# Patient Record
Sex: Female | Born: 1940 | Race: White | Marital: Married | Smoking: Never smoker
Health system: Southern US, Community
[De-identification: ages and names within clinical notes are randomized; demographics above are authoritative.]

## PROBLEM LIST (undated history)

## (undated) DIAGNOSIS — K635 Polyp of colon: Secondary | ICD-10-CM

## (undated) DIAGNOSIS — D689 Coagulation defect, unspecified: Secondary | ICD-10-CM

## (undated) DIAGNOSIS — M810 Age-related osteoporosis without current pathological fracture: Secondary | ICD-10-CM

## (undated) DIAGNOSIS — K649 Unspecified hemorrhoids: Secondary | ICD-10-CM

## (undated) DIAGNOSIS — I10 Essential (primary) hypertension: Secondary | ICD-10-CM

## (undated) DIAGNOSIS — R609 Edema, unspecified: Secondary | ICD-10-CM

## (undated) DIAGNOSIS — C50919 Malignant neoplasm of unspecified site of unspecified female breast: Secondary | ICD-10-CM

## (undated) HISTORY — DX: Malignant neoplasm of unspecified site of unspecified female breast: C50.919

## (undated) HISTORY — DX: Essential (primary) hypertension: I10

## (undated) HISTORY — DX: Coagulation defect, unspecified: D68.9

## (undated) HISTORY — DX: Polyp of colon: K63.5

## (undated) HISTORY — DX: Age-related osteoporosis without current pathological fracture: M81.0

## (undated) HISTORY — DX: Unspecified hemorrhoids: K64.9

## (undated) HISTORY — DX: Edema, unspecified: R60.9

---

## 1973-01-02 HISTORY — PX: DILATATION & CURETTAGE/HYSTEROSCOPY WITH TRUECLEAR: SHX6353

## 1973-01-02 HISTORY — PX: TUBAL LIGATION: SHX77

## 1998-01-02 HISTORY — PX: THYROIDECTOMY, PARTIAL: SHX18

## 2011-02-28 DIAGNOSIS — H6120 Impacted cerumen, unspecified ear: Secondary | ICD-10-CM | POA: Diagnosis not present

## 2011-08-21 DIAGNOSIS — I10 Essential (primary) hypertension: Secondary | ICD-10-CM | POA: Diagnosis not present

## 2011-08-21 DIAGNOSIS — Z1239 Encounter for other screening for malignant neoplasm of breast: Secondary | ICD-10-CM | POA: Diagnosis not present

## 2011-08-21 DIAGNOSIS — E039 Hypothyroidism, unspecified: Secondary | ICD-10-CM | POA: Diagnosis not present

## 2011-08-21 DIAGNOSIS — Z23 Encounter for immunization: Secondary | ICD-10-CM | POA: Diagnosis not present

## 2011-08-21 DIAGNOSIS — Z1211 Encounter for screening for malignant neoplasm of colon: Secondary | ICD-10-CM | POA: Diagnosis not present

## 2012-01-03 DIAGNOSIS — C50919 Malignant neoplasm of unspecified site of unspecified female breast: Secondary | ICD-10-CM

## 2012-01-03 DIAGNOSIS — Z923 Personal history of irradiation: Secondary | ICD-10-CM

## 2012-01-03 HISTORY — DX: Personal history of irradiation: Z92.3

## 2012-01-03 HISTORY — DX: Malignant neoplasm of unspecified site of unspecified female breast: C50.919

## 2012-01-03 HISTORY — PX: BREAST LUMPECTOMY: SHX2

## 2012-04-16 DIAGNOSIS — Z1231 Encounter for screening mammogram for malignant neoplasm of breast: Secondary | ICD-10-CM | POA: Diagnosis not present

## 2012-05-06 DIAGNOSIS — R928 Other abnormal and inconclusive findings on diagnostic imaging of breast: Secondary | ICD-10-CM | POA: Diagnosis not present

## 2012-05-16 DIAGNOSIS — R928 Other abnormal and inconclusive findings on diagnostic imaging of breast: Secondary | ICD-10-CM | POA: Diagnosis not present

## 2012-05-16 DIAGNOSIS — D0590 Unspecified type of carcinoma in situ of unspecified breast: Secondary | ICD-10-CM | POA: Diagnosis not present

## 2012-05-16 DIAGNOSIS — Z1231 Encounter for screening mammogram for malignant neoplasm of breast: Secondary | ICD-10-CM | POA: Diagnosis not present

## 2012-06-04 DIAGNOSIS — C50919 Malignant neoplasm of unspecified site of unspecified female breast: Secondary | ICD-10-CM | POA: Diagnosis not present

## 2012-06-24 DIAGNOSIS — C50419 Malignant neoplasm of upper-outer quadrant of unspecified female breast: Secondary | ICD-10-CM | POA: Diagnosis not present

## 2012-06-24 DIAGNOSIS — M810 Age-related osteoporosis without current pathological fracture: Secondary | ICD-10-CM | POA: Diagnosis not present

## 2012-06-24 DIAGNOSIS — Z9889 Other specified postprocedural states: Secondary | ICD-10-CM | POA: Diagnosis not present

## 2012-06-24 DIAGNOSIS — Z803 Family history of malignant neoplasm of breast: Secondary | ICD-10-CM | POA: Diagnosis not present

## 2012-06-24 DIAGNOSIS — N6019 Diffuse cystic mastopathy of unspecified breast: Secondary | ICD-10-CM | POA: Diagnosis not present

## 2012-06-24 DIAGNOSIS — E785 Hyperlipidemia, unspecified: Secondary | ICD-10-CM | POA: Diagnosis not present

## 2012-06-24 DIAGNOSIS — D0590 Unspecified type of carcinoma in situ of unspecified breast: Secondary | ICD-10-CM | POA: Diagnosis not present

## 2012-06-24 DIAGNOSIS — Z9851 Tubal ligation status: Secondary | ICD-10-CM | POA: Diagnosis not present

## 2012-06-24 DIAGNOSIS — Z86010 Personal history of colonic polyps: Secondary | ICD-10-CM | POA: Diagnosis not present

## 2012-06-24 DIAGNOSIS — N6049 Mammary duct ectasia of unspecified breast: Secondary | ICD-10-CM | POA: Diagnosis not present

## 2012-06-24 DIAGNOSIS — L821 Other seborrheic keratosis: Secondary | ICD-10-CM | POA: Diagnosis not present

## 2012-06-24 DIAGNOSIS — Z79899 Other long term (current) drug therapy: Secondary | ICD-10-CM | POA: Diagnosis not present

## 2012-06-24 DIAGNOSIS — E890 Postprocedural hypothyroidism: Secondary | ICD-10-CM | POA: Diagnosis not present

## 2012-06-24 DIAGNOSIS — C50919 Malignant neoplasm of unspecified site of unspecified female breast: Secondary | ICD-10-CM | POA: Diagnosis not present

## 2012-06-24 DIAGNOSIS — I10 Essential (primary) hypertension: Secondary | ICD-10-CM | POA: Diagnosis not present

## 2012-06-24 DIAGNOSIS — K649 Unspecified hemorrhoids: Secondary | ICD-10-CM | POA: Diagnosis not present

## 2012-06-24 DIAGNOSIS — D485 Neoplasm of uncertain behavior of skin: Secondary | ICD-10-CM | POA: Diagnosis not present

## 2012-06-28 DIAGNOSIS — D0590 Unspecified type of carcinoma in situ of unspecified breast: Secondary | ICD-10-CM | POA: Diagnosis not present

## 2012-06-28 DIAGNOSIS — Z170 Estrogen receptor positive status [ER+]: Secondary | ICD-10-CM | POA: Diagnosis not present

## 2012-07-03 DIAGNOSIS — N841 Polyp of cervix uteri: Secondary | ICD-10-CM | POA: Diagnosis not present

## 2012-07-10 DIAGNOSIS — D0590 Unspecified type of carcinoma in situ of unspecified breast: Secondary | ICD-10-CM | POA: Diagnosis not present

## 2012-07-11 DIAGNOSIS — D0590 Unspecified type of carcinoma in situ of unspecified breast: Secondary | ICD-10-CM | POA: Diagnosis not present

## 2012-07-17 DIAGNOSIS — M899 Disorder of bone, unspecified: Secondary | ICD-10-CM | POA: Diagnosis not present

## 2012-07-17 DIAGNOSIS — E039 Hypothyroidism, unspecified: Secondary | ICD-10-CM | POA: Diagnosis not present

## 2012-07-17 DIAGNOSIS — I10 Essential (primary) hypertension: Secondary | ICD-10-CM | POA: Diagnosis not present

## 2012-07-17 DIAGNOSIS — Z1211 Encounter for screening for malignant neoplasm of colon: Secondary | ICD-10-CM | POA: Diagnosis not present

## 2012-07-30 DIAGNOSIS — D485 Neoplasm of uncertain behavior of skin: Secondary | ICD-10-CM | POA: Diagnosis not present

## 2012-07-31 DIAGNOSIS — N950 Postmenopausal bleeding: Secondary | ICD-10-CM | POA: Diagnosis not present

## 2012-07-31 DIAGNOSIS — N842 Polyp of vagina: Secondary | ICD-10-CM | POA: Diagnosis not present

## 2012-07-31 DIAGNOSIS — I10 Essential (primary) hypertension: Secondary | ICD-10-CM | POA: Diagnosis not present

## 2012-07-31 DIAGNOSIS — E785 Hyperlipidemia, unspecified: Secondary | ICD-10-CM | POA: Diagnosis not present

## 2012-07-31 DIAGNOSIS — Z79899 Other long term (current) drug therapy: Secondary | ICD-10-CM | POA: Diagnosis not present

## 2012-07-31 DIAGNOSIS — C50919 Malignant neoplasm of unspecified site of unspecified female breast: Secondary | ICD-10-CM | POA: Diagnosis not present

## 2012-07-31 DIAGNOSIS — N840 Polyp of corpus uteri: Secondary | ICD-10-CM | POA: Diagnosis not present

## 2012-07-31 DIAGNOSIS — M810 Age-related osteoporosis without current pathological fracture: Secondary | ICD-10-CM | POA: Diagnosis not present

## 2012-07-31 DIAGNOSIS — N841 Polyp of cervix uteri: Secondary | ICD-10-CM | POA: Diagnosis not present

## 2012-07-31 DIAGNOSIS — E890 Postprocedural hypothyroidism: Secondary | ICD-10-CM | POA: Diagnosis not present

## 2012-08-01 DIAGNOSIS — N841 Polyp of cervix uteri: Secondary | ICD-10-CM | POA: Diagnosis not present

## 2012-08-01 DIAGNOSIS — N842 Polyp of vagina: Secondary | ICD-10-CM | POA: Diagnosis not present

## 2012-08-02 DIAGNOSIS — R799 Abnormal finding of blood chemistry, unspecified: Secondary | ICD-10-CM | POA: Diagnosis not present

## 2012-08-02 DIAGNOSIS — R609 Edema, unspecified: Secondary | ICD-10-CM | POA: Diagnosis not present

## 2012-08-02 DIAGNOSIS — D0590 Unspecified type of carcinoma in situ of unspecified breast: Secondary | ICD-10-CM | POA: Diagnosis not present

## 2012-08-02 DIAGNOSIS — I10 Essential (primary) hypertension: Secondary | ICD-10-CM | POA: Diagnosis not present

## 2012-08-07 DIAGNOSIS — M899 Disorder of bone, unspecified: Secondary | ICD-10-CM | POA: Diagnosis not present

## 2012-08-07 DIAGNOSIS — M949 Disorder of cartilage, unspecified: Secondary | ICD-10-CM | POA: Diagnosis not present

## 2012-08-08 DIAGNOSIS — D0590 Unspecified type of carcinoma in situ of unspecified breast: Secondary | ICD-10-CM | POA: Diagnosis not present

## 2012-08-09 DIAGNOSIS — D0590 Unspecified type of carcinoma in situ of unspecified breast: Secondary | ICD-10-CM | POA: Diagnosis not present

## 2012-08-12 DIAGNOSIS — D0590 Unspecified type of carcinoma in situ of unspecified breast: Secondary | ICD-10-CM | POA: Diagnosis not present

## 2012-08-13 DIAGNOSIS — D0590 Unspecified type of carcinoma in situ of unspecified breast: Secondary | ICD-10-CM | POA: Diagnosis not present

## 2012-08-14 DIAGNOSIS — D0590 Unspecified type of carcinoma in situ of unspecified breast: Secondary | ICD-10-CM | POA: Diagnosis not present

## 2012-08-15 DIAGNOSIS — D0590 Unspecified type of carcinoma in situ of unspecified breast: Secondary | ICD-10-CM | POA: Diagnosis not present

## 2012-08-16 DIAGNOSIS — D0590 Unspecified type of carcinoma in situ of unspecified breast: Secondary | ICD-10-CM | POA: Diagnosis not present

## 2012-08-19 DIAGNOSIS — D0590 Unspecified type of carcinoma in situ of unspecified breast: Secondary | ICD-10-CM | POA: Diagnosis not present

## 2012-08-20 DIAGNOSIS — D0590 Unspecified type of carcinoma in situ of unspecified breast: Secondary | ICD-10-CM | POA: Diagnosis not present

## 2012-08-21 DIAGNOSIS — D0590 Unspecified type of carcinoma in situ of unspecified breast: Secondary | ICD-10-CM | POA: Diagnosis not present

## 2012-08-22 DIAGNOSIS — D0590 Unspecified type of carcinoma in situ of unspecified breast: Secondary | ICD-10-CM | POA: Diagnosis not present

## 2012-08-23 DIAGNOSIS — D0590 Unspecified type of carcinoma in situ of unspecified breast: Secondary | ICD-10-CM | POA: Diagnosis not present

## 2012-08-26 DIAGNOSIS — D0590 Unspecified type of carcinoma in situ of unspecified breast: Secondary | ICD-10-CM | POA: Diagnosis not present

## 2012-08-27 DIAGNOSIS — D0590 Unspecified type of carcinoma in situ of unspecified breast: Secondary | ICD-10-CM | POA: Diagnosis not present

## 2012-08-28 DIAGNOSIS — D0590 Unspecified type of carcinoma in situ of unspecified breast: Secondary | ICD-10-CM | POA: Diagnosis not present

## 2012-08-29 DIAGNOSIS — D0590 Unspecified type of carcinoma in situ of unspecified breast: Secondary | ICD-10-CM | POA: Diagnosis not present

## 2012-08-30 DIAGNOSIS — D0590 Unspecified type of carcinoma in situ of unspecified breast: Secondary | ICD-10-CM | POA: Diagnosis not present

## 2012-09-03 DIAGNOSIS — D0590 Unspecified type of carcinoma in situ of unspecified breast: Secondary | ICD-10-CM | POA: Diagnosis not present

## 2012-09-04 DIAGNOSIS — D0590 Unspecified type of carcinoma in situ of unspecified breast: Secondary | ICD-10-CM | POA: Diagnosis not present

## 2012-09-05 DIAGNOSIS — D0590 Unspecified type of carcinoma in situ of unspecified breast: Secondary | ICD-10-CM | POA: Diagnosis not present

## 2012-09-06 DIAGNOSIS — D0590 Unspecified type of carcinoma in situ of unspecified breast: Secondary | ICD-10-CM | POA: Diagnosis not present

## 2012-09-09 DIAGNOSIS — I10 Essential (primary) hypertension: Secondary | ICD-10-CM | POA: Diagnosis not present

## 2012-09-09 DIAGNOSIS — R799 Abnormal finding of blood chemistry, unspecified: Secondary | ICD-10-CM | POA: Diagnosis not present

## 2012-09-09 DIAGNOSIS — D0590 Unspecified type of carcinoma in situ of unspecified breast: Secondary | ICD-10-CM | POA: Diagnosis not present

## 2012-10-02 DIAGNOSIS — D0590 Unspecified type of carcinoma in situ of unspecified breast: Secondary | ICD-10-CM | POA: Diagnosis not present

## 2012-10-10 DIAGNOSIS — I10 Essential (primary) hypertension: Secondary | ICD-10-CM | POA: Diagnosis not present

## 2012-10-10 DIAGNOSIS — K648 Other hemorrhoids: Secondary | ICD-10-CM | POA: Diagnosis not present

## 2012-10-10 DIAGNOSIS — K5730 Diverticulosis of large intestine without perforation or abscess without bleeding: Secondary | ICD-10-CM | POA: Diagnosis not present

## 2012-10-10 DIAGNOSIS — Z860100 Personal history of colon polyps, unspecified: Secondary | ICD-10-CM | POA: Diagnosis not present

## 2012-10-10 DIAGNOSIS — D126 Benign neoplasm of colon, unspecified: Secondary | ICD-10-CM | POA: Diagnosis not present

## 2012-10-10 DIAGNOSIS — Z1211 Encounter for screening for malignant neoplasm of colon: Secondary | ICD-10-CM | POA: Diagnosis not present

## 2012-10-10 DIAGNOSIS — M899 Disorder of bone, unspecified: Secondary | ICD-10-CM | POA: Diagnosis not present

## 2012-10-11 DIAGNOSIS — D126 Benign neoplasm of colon, unspecified: Secondary | ICD-10-CM | POA: Diagnosis not present

## 2012-11-19 DIAGNOSIS — D0590 Unspecified type of carcinoma in situ of unspecified breast: Secondary | ICD-10-CM | POA: Diagnosis not present

## 2012-11-22 DIAGNOSIS — Z23 Encounter for immunization: Secondary | ICD-10-CM | POA: Diagnosis not present

## 2013-03-18 DIAGNOSIS — E039 Hypothyroidism, unspecified: Secondary | ICD-10-CM | POA: Diagnosis not present

## 2013-03-18 DIAGNOSIS — I10 Essential (primary) hypertension: Secondary | ICD-10-CM | POA: Diagnosis not present

## 2013-03-18 DIAGNOSIS — E7800 Pure hypercholesterolemia, unspecified: Secondary | ICD-10-CM | POA: Diagnosis not present

## 2013-03-18 DIAGNOSIS — Z853 Personal history of malignant neoplasm of breast: Secondary | ICD-10-CM | POA: Diagnosis not present

## 2013-03-26 DIAGNOSIS — D0590 Unspecified type of carcinoma in situ of unspecified breast: Secondary | ICD-10-CM | POA: Diagnosis not present

## 2013-03-27 DIAGNOSIS — D0590 Unspecified type of carcinoma in situ of unspecified breast: Secondary | ICD-10-CM | POA: Diagnosis not present

## 2013-03-27 DIAGNOSIS — Z853 Personal history of malignant neoplasm of breast: Secondary | ICD-10-CM | POA: Diagnosis not present

## 2013-03-28 DIAGNOSIS — M79609 Pain in unspecified limb: Secondary | ICD-10-CM | POA: Diagnosis not present

## 2013-03-28 DIAGNOSIS — I82409 Acute embolism and thrombosis of unspecified deep veins of unspecified lower extremity: Secondary | ICD-10-CM | POA: Diagnosis not present

## 2013-04-07 DIAGNOSIS — I82890 Acute embolism and thrombosis of other specified veins: Secondary | ICD-10-CM | POA: Diagnosis not present

## 2013-04-07 DIAGNOSIS — I809 Phlebitis and thrombophlebitis of unspecified site: Secondary | ICD-10-CM | POA: Diagnosis not present

## 2013-04-08 DIAGNOSIS — I749 Embolism and thrombosis of unspecified artery: Secondary | ICD-10-CM | POA: Diagnosis not present

## 2013-04-14 DIAGNOSIS — I809 Phlebitis and thrombophlebitis of unspecified site: Secondary | ICD-10-CM | POA: Diagnosis not present

## 2013-04-14 DIAGNOSIS — D0590 Unspecified type of carcinoma in situ of unspecified breast: Secondary | ICD-10-CM | POA: Diagnosis not present

## 2013-05-06 DIAGNOSIS — I749 Embolism and thrombosis of unspecified artery: Secondary | ICD-10-CM | POA: Diagnosis not present

## 2013-06-16 DIAGNOSIS — D0590 Unspecified type of carcinoma in situ of unspecified breast: Secondary | ICD-10-CM | POA: Diagnosis not present

## 2013-06-16 DIAGNOSIS — I809 Phlebitis and thrombophlebitis of unspecified site: Secondary | ICD-10-CM | POA: Diagnosis not present

## 2013-08-15 ENCOUNTER — Telehealth: Payer: Self-pay | Admitting: *Deleted

## 2013-08-15 NOTE — Telephone Encounter (Signed)
Received records from outside facility and I called pt and confirmed 08/28/13 appt w/ her.  Placed note for pt to be given intake form at time of check in.  Took paperwork to HIM to create chart.

## 2013-08-28 ENCOUNTER — Encounter: Payer: Self-pay | Admitting: Hematology and Oncology

## 2013-08-28 ENCOUNTER — Ambulatory Visit: Payer: Medicare Other

## 2013-08-28 ENCOUNTER — Encounter (INDEPENDENT_AMBULATORY_CARE_PROVIDER_SITE_OTHER): Payer: Self-pay

## 2013-08-28 ENCOUNTER — Ambulatory Visit (HOSPITAL_BASED_OUTPATIENT_CLINIC_OR_DEPARTMENT_OTHER): Payer: Medicare Other | Admitting: Hematology and Oncology

## 2013-08-28 VITALS — BP 118/74 | HR 82 | Temp 97.8°F | Resp 18 | Ht 64.0 in | Wt 151.4 lb

## 2013-08-28 DIAGNOSIS — M810 Age-related osteoporosis without current pathological fracture: Secondary | ICD-10-CM

## 2013-08-28 DIAGNOSIS — D0590 Unspecified type of carcinoma in situ of unspecified breast: Secondary | ICD-10-CM | POA: Diagnosis not present

## 2013-08-28 DIAGNOSIS — Z923 Personal history of irradiation: Secondary | ICD-10-CM | POA: Diagnosis not present

## 2013-08-28 DIAGNOSIS — Z86718 Personal history of other venous thrombosis and embolism: Secondary | ICD-10-CM

## 2013-08-28 DIAGNOSIS — D0512 Intraductal carcinoma in situ of left breast: Secondary | ICD-10-CM | POA: Insufficient documentation

## 2013-08-28 DIAGNOSIS — C50919 Malignant neoplasm of unspecified site of unspecified female breast: Secondary | ICD-10-CM

## 2013-08-28 DIAGNOSIS — C50912 Malignant neoplasm of unspecified site of left female breast: Secondary | ICD-10-CM

## 2013-08-28 MED ORDER — EXEMESTANE 25 MG PO TABS: 25.0000 mg | ORAL_TABLET | Freq: Every day | ORAL | Status: DC

## 2013-08-28 NOTE — Progress Notes (Signed)
[facility] CONSULT NOTE  Patient Care Team: Lajean Manes, MD as PCP - General (Internal Medicine)  CHIEF COMPLAINTS/PURPOSE OF CONSULTATION:  To establish oncology care with a history of left breast DCIS  HISTORY OF PRESENTING ILLNESS:  Laurie Gomez 73 y.o. Caucasian female is here because of prior history of left breast DCIS. She presented with a routine screening mammogram that revealed an abnormality in the left breast that led to a biopsy that revealed DCIS Nuclear grade 3 solid and comedo types associated with dystrophic calcifications and probable early invasive carcinoma.. She then went on to undergo a left breast lumpectomy on 06/24/2012 that revealed DCIS high grade with central necrosis comedo carcinoma occasional focus suspicious but not diagnostic for microinvasion node-negative margins -2 sentinel lymph nodes negative. Patient was staged as Tis N0 MX high-grade DCIS.  She could not tolerate adjuvant hormonal therapy. Initially she was given tamoxifen but developed a superficial vein thrombosis and had to stop tamoxifen. She feels are out of for 3 months that resolve the thrombus. She was put on Aromasin which caused her intense body aches and pains and she herself stopped the medication beginning of August 2015. She decided to move to [facility] to be closer to her family and is here today to establish care with [place]. She reports that she and her husband are very active day 1 to help take care of the grandchildren and helping her daughter and her family who've on business in this area.  I reviewed her records extensively and collaborated the history with the patient.  SUMMARY OF ONCOLOGIC HISTORY:   Breast cancer   06/24/2012 Surgery Left breast lumpectomy and sentinel lymph node biopsy DCIS   08/30/2012 - 09/28/2012 Radiation Therapy Radiation to lumpectomy site   10/02/2012 -  Anti-estrogen oral therapy Tamoxifen 20 mg daily started 10/02/2012 discontinued due to  superficial leg vein thrombosis that was treated with 3 months of Xarelto. Aromasin 25 mg started July 2015 stopped for diffuse muscle aches, restarted 08/28/2013 to take with Claritin-D    In terms of breast cancer risk profile:  She menarched at early age of 44 and went to menopause at age 46  She had 2 pregnancy, her first child was born at age 4  She did not received birth control pills .  She was never exposed to fertility medications or hormone replacement therapy.  She has family history of Breast cancer  MEDICAL HISTORY:  Past Medical History  Diagnosis Date  . Breast cancer   . Osteoporosis   . superifical thrombosis   . Colon polyps   . Hemorrhoids   . Edema   . Hypertension     SURGICAL HISTORY: Past Surgical History  Procedure Laterality Date  . Thyroidectomy, partial  2000  . Breast lumpectomy  2014  . Tubal ligation  1975  . Dilatation & curettage/hysteroscopy with trueclear  1975    SOCIAL HISTORY: History   Social History  . Marital Status: Married    Spouse Name: N/A    Number of Children: N/A  . Years of Education: N/A   Occupational History  . Not on file.   Social History Main Topics  . Smoking status: Never Smoker   . Smokeless tobacco: Never Used  . Alcohol Use: Yes  . Drug Use: No  . Sexual Activity: Yes   Other Topics Concern  . Not on file   Social History Narrative  . No narrative on file    FAMILY HISTORY: Family History  Problem Relation Age of Onset  . Cancer Sister 43    Breast cancer  . Cancer Cousin 64    Breast cancer    ALLERGIES:  has no allergies on file.  MEDICATIONS:  Current Outpatient Prescriptions  Medication Sig Dispense Refill  . amlodipine-benazepril (LOTREL) 2.5-10 MG per capsule Take 1 capsule by mouth.      . levothyroxine (SYNTHROID) 75 MCG tablet TAKE ONE TABLET BY MOUTH ONE TIME DAILY      . triamterene-hydrochlorothiazide (MAXZIDE) 75-50 MG per tablet TAKE 1/2 tablet BY MOUTH ONE TIME DAILY       . Calcium Carbonate-Vitamin D 600-125 MG-UNIT TABS Take 1 tablet by mouth.      Marland Kitchen exemestane (AROMASIN) 25 MG tablet Take 1 tablet (25 mg total) by mouth daily after breakfast.  90 tablet  3  . Multiple Vitamin (MULTIVITAMIN) tablet Take 1 tablet by mouth.      . vitamin B-12 (CYANOCOBALAMIN) 50 MCG tablet Take 2 tablets by mouth.       No current facility-administered medications for this visit.    REVIEW OF SYSTEMS:   Constitutional: Denies fevers, chills or abnormal night sweats Eyes: Denies blurriness of vision, double vision or watery eyes Ears, nose, mouth, throat, and face: Denies mucositis or sore throat Respiratory: Denies cough, dyspnea or wheezes Cardiovascular: Denies palpitation, chest discomfort or lower extremity swelling Gastrointestinal:  Denies nausea, heartburn or change in bowel habits Skin: Denies abnormal skin rashes Lymphatics: Denies new lymphadenopathy or easy bruising Neurological:Denies numbness, tingling or new weaknesses Behavioral/Psych: Mood is stable, no new changes  Breast: Left breast nodularity ever since radiation was done All other systems were reviewed with the patient and are negative.  PHYSICAL EXAMINATION: ECOG PERFORMANCE STATUS: 0 - Asymptomatic  Filed Vitals:   08/28/13 1420  BP: 118/74  Pulse: 82  Temp: 97.8 F (36.6 C)  Resp: 18   Filed Weights   08/28/13 1420  Weight: 151 lb 6.4 oz (68.675 kg)    GENERAL:alert, no distress and comfortable SKIN: skin color, texture, turgor are normal, no rashes or significant lesions EYES: normal, conjunctiva are pink and non-injected, sclera clear OROPHARYNX:no exudate, no erythema and lips, buccal mucosa, and tongue normal  NECK: supple, thyroid normal size, non-tender, without nodularity LYMPH:  no palpable lymphadenopathy in the cervical, axillary or inguinal LUNGS: clear to auscultation and percussion with normal breathing effort HEART: regular rate & rhythm and no murmurs and no lower  extremity edema ABDOMEN:abdomen soft, non-tender and normal bowel sounds Musculoskeletal:no cyanosis of digits and no clubbing  PSYCH: alert & oriented x 3 with fluent speech NEURO: no focal motor/sensory deficits BREAST: No palpable nodules in breast. No palpable axillary or supraclavicular lymphadenopathy. Left breast skin and subcutis tissues feel thickened and nodular but no actual lumps are palpable  LABORATORY DATA:   RADIOGRAPHIC STUDIES: March 2015 mammograms no abnormalities noted  ASSESSMENT AND PLAN:  Breast cancer DCIS left breast status post lumpectomy and radiation therapy: Patient developed SVT on tamoxifen and had to be stopped. She attempted Aromasin briefly but a month and had to stop it because of diffuse muscle aches and pains. She will in [place] to be closer to her family and is here today to establish oncology care with [place]. She gets annual mammograms in March of every year. March 2014 was her last mammogram. She reports increased density in the left breast ever since he got radiation therapy.  Breast exam did not reveal any abnormalities of significance. The left breast  felt thickened in the skin and subcutaneous tissues to the radiation therapy. I recommended a followup in March after undergoing a mammogram.  Survivorship: Encouraged her to physical activity and exercise daily along with increasing fruits and vegetable intake and decreasing red meat.   All questions were answered. The patient knows to call the clinic with any problems, questions or concerns. I spent 40 minutes counseling the patient face to face. The total time spent in the appointment was 60 minutes and more than 50% was on counseling.     Rulon Eisenmenger, MD 08/28/2013 3:40 PM

## 2013-08-28 NOTE — Progress Notes (Signed)
Checked in new pt with no financial concerns. °

## 2013-08-28 NOTE — Assessment & Plan Note (Signed)
DCIS left breast status post lumpectomy and radiation therapy: Patient developed SVT on tamoxifen and had to be stopped. She attempted Aromasin briefly but a month and had to stop it because of diffuse muscle aches and pains. She will in [place] to be closer to her family and is here today to establish oncology care with [place]. She gets annual mammograms in March of every year. March 2014 was her last mammogram. She reports increased density in the left breast ever since he got radiation therapy.  Breast exam did not reveal any abnormalities of significance. The left breast felt thickened in the skin and subcutaneous tissues to the radiation therapy. I recommended a followup in March after undergoing a mammogram.  Survivorship: Encouraged her to physical activity and exercise daily along with increasing fruits and vegetable intake and decreasing red meat.

## 2013-08-29 ENCOUNTER — Telehealth: Payer: Self-pay | Admitting: Hematology and Oncology

## 2013-08-29 DIAGNOSIS — Z79899 Other long term (current) drug therapy: Secondary | ICD-10-CM | POA: Diagnosis not present

## 2013-08-29 DIAGNOSIS — R609 Edema, unspecified: Secondary | ICD-10-CM | POA: Diagnosis not present

## 2013-08-29 DIAGNOSIS — C50919 Malignant neoplasm of unspecified site of unspecified female breast: Secondary | ICD-10-CM | POA: Diagnosis not present

## 2013-08-29 DIAGNOSIS — I10 Essential (primary) hypertension: Secondary | ICD-10-CM | POA: Diagnosis not present

## 2013-08-29 DIAGNOSIS — E039 Hypothyroidism, unspecified: Secondary | ICD-10-CM | POA: Diagnosis not present

## 2013-08-29 DIAGNOSIS — Z23 Encounter for immunization: Secondary | ICD-10-CM | POA: Diagnosis not present

## 2013-08-29 NOTE — Telephone Encounter (Signed)
, °

## 2013-09-02 ENCOUNTER — Encounter: Payer: Self-pay | Admitting: Hematology and Oncology

## 2013-09-02 DIAGNOSIS — E039 Hypothyroidism, unspecified: Secondary | ICD-10-CM | POA: Diagnosis not present

## 2013-09-02 DIAGNOSIS — Z79899 Other long term (current) drug therapy: Secondary | ICD-10-CM | POA: Diagnosis not present

## 2013-09-02 DIAGNOSIS — I10 Essential (primary) hypertension: Secondary | ICD-10-CM | POA: Diagnosis not present

## 2013-09-03 ENCOUNTER — Telehealth: Payer: Self-pay

## 2013-09-03 DIAGNOSIS — C50919 Malignant neoplasm of unspecified site of unspecified female breast: Secondary | ICD-10-CM

## 2013-09-03 NOTE — Telephone Encounter (Signed)
Message printed and to provider for review.  Established patient work in appointment request for today by collaborative nurse.

## 2013-09-03 NOTE — Telephone Encounter (Signed)
Call rcvd from Dr. Felipa Eth 9/1 re: LFT he had done.  Alk Phos 152, AST 259 ALT 381.  Pt sent MyChart msg 9/2 to Dr. Lindi Adie.  Per Dr Lindi Adie, bring pt in for labs and ofc visit.  Ofc visit avail at 77.  Let pt know labs at 1030, office visit 11.  Pt voiced understanding.  Appts made.  Labs entered

## 2013-09-04 ENCOUNTER — Ambulatory Visit (HOSPITAL_BASED_OUTPATIENT_CLINIC_OR_DEPARTMENT_OTHER): Payer: Medicare Other | Admitting: Hematology and Oncology

## 2013-09-04 ENCOUNTER — Other Ambulatory Visit (HOSPITAL_BASED_OUTPATIENT_CLINIC_OR_DEPARTMENT_OTHER): Payer: Medicare Other

## 2013-09-04 ENCOUNTER — Telehealth: Payer: Self-pay | Admitting: Hematology and Oncology

## 2013-09-04 ENCOUNTER — Other Ambulatory Visit: Payer: Self-pay | Admitting: Hematology and Oncology

## 2013-09-04 VITALS — BP 132/76 | HR 77 | Temp 98.3°F | Resp 18 | Ht <= 58 in | Wt 178.1 lb

## 2013-09-04 DIAGNOSIS — D0590 Unspecified type of carcinoma in situ of unspecified breast: Secondary | ICD-10-CM

## 2013-09-04 DIAGNOSIS — K759 Inflammatory liver disease, unspecified: Secondary | ICD-10-CM

## 2013-09-04 DIAGNOSIS — C50919 Malignant neoplasm of unspecified site of unspecified female breast: Secondary | ICD-10-CM

## 2013-09-04 LAB — COMPREHENSIVE METABOLIC PANEL (CC13)
ALK PHOS: 198 U/L — AB (ref 40–150)
ANION GAP: 9 meq/L (ref 3–11)
AST: 100 U/L — ABNORMAL HIGH (ref 5–34)
Albumin: 3.7 g/dL (ref 3.5–5.0)
BILIRUBIN TOTAL: 0.38 mg/dL (ref 0.20–1.20)
BUN: 19.4 mg/dL (ref 7.0–26.0)
CO2: 26 mEq/L (ref 22–29)
Calcium: 9.4 mg/dL (ref 8.4–10.4)
Chloride: 103 mEq/L (ref 98–109)
Creatinine: 1.3 mg/dL — ABNORMAL HIGH (ref 0.6–1.1)
GLUCOSE: 89 mg/dL (ref 70–140)
Potassium: 4 mEq/L (ref 3.5–5.1)
Sodium: 138 mEq/L (ref 136–145)
TOTAL PROTEIN: 7.3 g/dL (ref 6.4–8.3)

## 2013-09-04 LAB — CBC WITH DIFFERENTIAL/PLATELET
BASO%: 0.7 % (ref 0.0–2.0)
Basophils Absolute: 0.1 10*3/uL (ref 0.0–0.1)
EOS%: 4 % (ref 0.0–7.0)
Eosinophils Absolute: 0.3 10*3/uL (ref 0.0–0.5)
HEMATOCRIT: 40.3 % (ref 34.8–46.6)
HGB: 13.3 g/dL (ref 11.6–15.9)
LYMPH%: 31.4 % (ref 14.0–49.7)
MCH: 29.3 pg (ref 25.1–34.0)
MCHC: 33 g/dL (ref 31.5–36.0)
MCV: 88.8 fL (ref 79.5–101.0)
MONO#: 0.8 10*3/uL (ref 0.1–0.9)
MONO%: 11.9 % (ref 0.0–14.0)
NEUT#: 3.7 10*3/uL (ref 1.5–6.5)
NEUT%: 52 % (ref 38.4–76.8)
Platelets: 229 10*3/uL (ref 145–400)
RBC: 4.54 10*6/uL (ref 3.70–5.45)
RDW: 13.8 % (ref 11.2–14.5)
WBC: 7.1 10*3/uL (ref 3.9–10.3)
lymph#: 2.2 10*3/uL (ref 0.9–3.3)

## 2013-09-04 NOTE — Assessment & Plan Note (Signed)
Elevated ALT AST and alkaline phosphatase. We do not have a baseline liver function tests prior to starting Aromasin. I reviewed her liver function tests from her primary care physician office and cons. It appears that the AST has come down but the ALT is continuing to remain high at 375 today. As mentioned above he would like to afford Aromasin and recheck labs in a month. Also get an ultrasound of the abdomen and hepatitis viral titers.

## 2013-09-04 NOTE — Telephone Encounter (Signed)
per pof to sch pt appt-sch appt-sent VG an email to adv to put order in -adv pt someone will call her to sch once appt put in-pt understood

## 2013-09-04 NOTE — Assessment & Plan Note (Signed)
DCIS left breast teslas lumpectomy and radiation: Patient could not tolerate tamoxifen because of SVT. I initiated Aromasin on August 27 and she was noted to have elevated AST ALT and alkaline phosphatase by Dr. Felipa Eth her primary care physician. She was complaining of epigastric distress and discomfort and because of both of these issues she is here today in our office. Her exam does not reveal any tenderness in the epigastrium or in the gallbladder area. Blood work done today reveals that she continues to have elevated ALT 375, alkaline phosphatase is gone up 198 and AST has come down. It is unclear the cause of elevated liver function tests it could be related to Aromasin although it is very rare cholestatic jaundice has been reported. Her bilirubin is normal hands there is no evidence of jaundice. She also takes lots of supplements I am not sure which may have interacted with other medications can cause elevation of AST and ALT.  I would like to hold Aromasin for a month and recheck her labs a month later. We will get an ultrasound of the abdomen and when she comes back we will send for hepatitis A, B, and C.

## 2013-09-04 NOTE — Progress Notes (Signed)
Patient Care Team: Lajean Manes, MD as PCP - General (Internal Medicine)  DIAGNOSIS: Breast cancer   Primary site: Breast (Left)   Staging method: AJCC 7th Edition   Clinical: (Tis (DCIS))   Pathologic free text: [id number]   Pathologic: (Tis (DCIS)) signed by Rulon Eisenmenger, MD on 08/28/2013  3:35 PM   Summary: (Tis (DCIS))   SUMMARY OF ONCOLOGIC HISTORY:   Breast cancer   06/24/2012 Surgery Left breast lumpectomy and sentinel lymph node biopsy DCIS   08/30/2012 - 09/28/2012 Radiation Therapy Radiation to lumpectomy site   10/02/2012 -  Anti-estrogen oral therapy Tamoxifen 20 mg daily started 10/02/2012 discontinued due to superficial leg vein thrombosis that was treated with 3 months of Xarelto. Aromasin 25 mg started July 2015 stopped for diffuse muscle aches, restarted 08/28/2013 to take with Claritin-D    CHIEF COMPLIANT: Epigastric abdominal discomfort  INTERVAL HISTORY: Ms. Laurie Gomez is a 73 year old Caucasian lady with above-mentioned history of DCIS could not tolerate tamoxifen because of the facial vein thrombosis. I started her on antiestrogen therapy with Aromasin in August 2015 to take with Claritin-D. After taking this he started noticing abdominal epigastric discomfort. She saw her primary care physician who ordered blood tests that showed elevation of ALT AST and alkaline phosphatase. Because of these issues we are seeing her today. It appears that the muscle cramps that she had previously with Aromasin are no longer there because of Claritin-D. She tried to take the Aromasin at different times of the day but she seems to get abdominal discomfort at the end of the day no matter what.   REVIEW OF SYSTEMS:   Constitutional: Denies fevers, chills or abnormal weight loss Eyes: Denies blurriness of vision Ears, nose, mouth, throat, and face: Denies mucositis or sore throat Respiratory: Denies cough, dyspnea or wheezes Cardiovascular: Denies palpitation, chest discomfort or lower extremity  swelling Gastrointestinal:  Denies nausea, heartburn or change in bowel habits Skin: Denies abnormal skin rashes Lymphatics: Denies new lymphadenopathy or easy bruising Neurological:Denies numbness, tingling or new weaknesses Behavioral/Psych: Mood is stable, no new changes  Breast:  denies any pain or lumps or nodules in either breasts All other systems were reviewed with the patient and are negative.  I have reviewed the past medical history, past surgical history, social history and family history with the patient and they are unchanged from previous note.  ALLERGIES:  has no allergies on file.  MEDICATIONS:  Current Outpatient Prescriptions  Medication Sig Dispense Refill  . amlodipine-benazepril (LOTREL) 2.5-10 MG per capsule Take 1 capsule by mouth.      . Calcium Carbonate-Vitamin D 600-125 MG-UNIT TABS Take 1 tablet by mouth.      Marland Kitchen exemestane (AROMASIN) 25 MG tablet Take 1 tablet (25 mg total) by mouth daily after breakfast.  90 tablet  3  . levothyroxine (SYNTHROID) 75 MCG tablet TAKE ONE TABLET BY MOUTH ONE TIME DAILY      . Multiple Vitamin (MULTIVITAMIN) tablet Take 1 tablet by mouth.      . triamterene-hydrochlorothiazide (MAXZIDE) 75-50 MG per tablet TAKE 1/2 tablet BY MOUTH ONE TIME DAILY      . vitamin B-12 (CYANOCOBALAMIN) 50 MCG tablet Take 2 tablets by mouth.       No current facility-administered medications for this visit.    PHYSICAL EXAMINATION: ECOG PERFORMANCE STATUS: 1 - Symptomatic but completely ambulatory  Filed Vitals:   09/04/13 1048  BP: 132/76  Pulse: 77  Temp: 98.3 F (36.8 C)  Resp: 18   Filed  Weights   09/04/13 1048  Weight: 178 lb 2 oz (80.797 kg)    GENERAL:alert, no distress and comfortable SKIN: skin color, texture, turgor are normal, no rashes or significant lesions EYES: normal, Conjunctiva are pink and non-injected, sclera clear OROPHARYNX:no exudate, no erythema and lips, buccal mucosa, and tongue normal  NECK: supple,  thyroid normal size, non-tender, without nodularity LYMPH:  no palpable lymphadenopathy in the cervical, axillary or inguinal LUNGS: clear to auscultation and percussion with normal breathing effort HEART: regular rate & rhythm and no murmurs and no lower extremity edema ABDOMEN:abdomen soft, non-tender and normal bowel sounds Musculoskeletal:no cyanosis of digits and no clubbing  NEURO: alert & oriented x 3 with fluent speech, no focal motor/sensory deficits  LABORATORY DATA:  I have reviewed the data as listed   Chemistry      Component Value Date/Time   NA 138 09/04/2013 1026   K 4.0 09/04/2013 1026   CO2 26 09/04/2013 1026   BUN 19.4 09/04/2013 1026   CREATININE 1.3* 09/04/2013 1026      Component Value Date/Time   CALCIUM 9.4 09/04/2013 1026   ALKPHOS 198* 09/04/2013 1026   AST 100* 09/04/2013 1026   ALT 375 Repeated and Verified* 09/04/2013 1026   BILITOT 0.38 09/04/2013 1026       Lab Results  Component Value Date   WBC 7.1 09/04/2013   HGB 13.3 09/04/2013   HCT 40.3 09/04/2013   MCV 88.8 09/04/2013   PLT 229 09/04/2013   NEUTROABS 3.7 09/04/2013     RADIOGRAPHIC STUDIES: I have personally reviewed the radiology reports and agreed with their findings. No results found.   ASSESSMENT & PLAN:  Breast cancer DCIS left breast teslas lumpectomy and radiation: Patient could not tolerate tamoxifen because of SVT. I initiated Aromasin on August 27 and she was noted to have elevated AST ALT and alkaline phosphatase by Dr. Felipa Eth her primary care physician. She was complaining of epigastric distress and discomfort and because of both of these issues she is here today in our office. Her exam does not reveal any tenderness in the epigastrium or in the gallbladder area. Blood work done today reveals that she continues to have elevated ALT 375, alkaline phosphatase is gone up 198 and AST has come down. It is unclear the cause of elevated liver function tests it could be related to Aromasin although it is  very rare cholestatic jaundice has been reported. Her bilirubin is normal hands there is no evidence of jaundice. She also takes lots of supplements I am not sure which may have interacted with other medications can cause elevation of AST and ALT.  I would like to hold Aromasin for a month and recheck her labs a month later. We will get an ultrasound of the abdomen and when she comes back we will send for hepatitis A, B, and C.  Hepatitis Elevated ALT AST and alkaline phosphatase. We do not have a baseline liver function tests prior to starting Aromasin. I reviewed her liver function tests from her primary care physician office and cons. It appears that the AST has come down but the ALT is continuing to remain high at 375 today. As mentioned above he would like to afford Aromasin and recheck labs in a month. Also get an ultrasound of the abdomen and hepatitis viral titers.   Orders Placed This Encounter  Procedures  . Comprehensive metabolic panel (Cmet) - [facility]    Standing Status: Future     Number of Occurrences:  Standing Expiration Date: 09/04/2014  . Lactate dehydrogenase (LDH) - [facility]    Standing Status: Future     Number of Occurrences:      Standing Expiration Date: 09/04/2014  . Hepatitis B surface antigen    Standing Status: Future     Number of Occurrences:      Standing Expiration Date: 09/04/2014  . Hepatitis C antibody    Standing Status: Future     Number of Occurrences:      Standing Expiration Date: 09/04/2014  . Hepatitis B core antibody, total    Standing Status: Future     Number of Occurrences:      Standing Expiration Date: 09/04/2014  . Hepatitis A antibody, IgM    Standing Status: Future     Number of Occurrences:      Standing Expiration Date: 09/04/2014  . Hepatitis B surface antibody    Standing Status: Future     Number of Occurrences:      Standing Expiration Date: 09/04/2014   The patient has a good understanding of the overall plan. she agrees with it. She  will call with any problems that may develop before her next visit here.  I spent 25 minutes counseling the patient face to face. The total time spent in the appointment was 30 minutes and more than 50% was on counseling and review of test results    Rulon Eisenmenger, MD 09/04/2013 12:01 PM

## 2013-09-09 ENCOUNTER — Ambulatory Visit (HOSPITAL_COMMUNITY)
Admission: RE | Admit: 2013-09-09 | Discharge: 2013-09-09 | Disposition: A | Discharge status: Home / Self Care | Payer: Medicare Other | Source: Ambulatory Visit | Attending: Hematology and Oncology | Admitting: Hematology and Oncology

## 2013-09-09 DIAGNOSIS — K759 Inflammatory liver disease, unspecified: Secondary | ICD-10-CM | POA: Diagnosis not present

## 2013-09-09 DIAGNOSIS — R748 Abnormal levels of other serum enzymes: Secondary | ICD-10-CM | POA: Diagnosis not present

## 2013-09-09 DIAGNOSIS — R945 Abnormal results of liver function studies: Secondary | ICD-10-CM | POA: Insufficient documentation

## 2013-09-09 DIAGNOSIS — R932 Abnormal findings on diagnostic imaging of liver and biliary tract: Secondary | ICD-10-CM | POA: Insufficient documentation

## 2013-09-16 ENCOUNTER — Encounter: Payer: Self-pay | Admitting: Hematology and Oncology

## 2013-09-17 ENCOUNTER — Encounter: Payer: Self-pay | Admitting: *Deleted

## 2013-09-19 DIAGNOSIS — Z23 Encounter for immunization: Secondary | ICD-10-CM | POA: Diagnosis not present

## 2013-09-26 ENCOUNTER — Encounter: Payer: Self-pay | Admitting: Hematology and Oncology

## 2013-10-02 ENCOUNTER — Encounter: Payer: Self-pay | Admitting: Hematology and Oncology

## 2013-10-02 ENCOUNTER — Other Ambulatory Visit (HOSPITAL_BASED_OUTPATIENT_CLINIC_OR_DEPARTMENT_OTHER): Payer: Medicare Other

## 2013-10-02 ENCOUNTER — Ambulatory Visit (HOSPITAL_BASED_OUTPATIENT_CLINIC_OR_DEPARTMENT_OTHER): Payer: Medicare Other | Admitting: Hematology and Oncology

## 2013-10-02 ENCOUNTER — Telehealth: Payer: Self-pay | Admitting: Hematology and Oncology

## 2013-10-02 VITALS — BP 128/63 | HR 75 | Temp 98.2°F | Resp 18 | Ht <= 58 in | Wt 180.6 lb

## 2013-10-02 DIAGNOSIS — D0592 Unspecified type of carcinoma in situ of left breast: Secondary | ICD-10-CM

## 2013-10-02 DIAGNOSIS — K759 Inflammatory liver disease, unspecified: Secondary | ICD-10-CM

## 2013-10-02 DIAGNOSIS — K716 Toxic liver disease with hepatitis, not elsewhere classified: Secondary | ICD-10-CM | POA: Diagnosis not present

## 2013-10-02 DIAGNOSIS — C50912 Malignant neoplasm of unspecified site of left female breast: Secondary | ICD-10-CM

## 2013-10-02 DIAGNOSIS — D0512 Intraductal carcinoma in situ of left breast: Secondary | ICD-10-CM

## 2013-10-02 DIAGNOSIS — R748 Abnormal levels of other serum enzymes: Secondary | ICD-10-CM | POA: Diagnosis not present

## 2013-10-02 LAB — COMPREHENSIVE METABOLIC PANEL (CC13)
ALT: 22 U/L (ref 0–55)
ANION GAP: 8 meq/L (ref 3–11)
AST: 16 U/L (ref 5–34)
Albumin: 3.8 g/dL (ref 3.5–5.0)
Alkaline Phosphatase: 81 U/L (ref 40–150)
BILIRUBIN TOTAL: 0.45 mg/dL (ref 0.20–1.20)
BUN: 21.9 mg/dL (ref 7.0–26.0)
CO2: 26 meq/L (ref 22–29)
CREATININE: 1.1 mg/dL (ref 0.6–1.1)
Calcium: 9.8 mg/dL (ref 8.4–10.4)
Chloride: 104 mEq/L (ref 98–109)
Glucose: 95 mg/dl (ref 70–140)
Potassium: 4.2 mEq/L (ref 3.5–5.1)
Sodium: 138 mEq/L (ref 136–145)
Total Protein: 7.2 g/dL (ref 6.4–8.3)

## 2013-10-02 LAB — HEPATITIS A ANTIBODY, IGM: Hep A IgM: NONREACTIVE

## 2013-10-02 LAB — LACTATE DEHYDROGENASE (CC13): LDH: 165 U/L (ref 125–245)

## 2013-10-02 LAB — HEPATITIS B CORE ANTIBODY, TOTAL: Hep B Core Total Ab: NONREACTIVE

## 2013-10-02 LAB — HEPATITIS C ANTIBODY: HCV Ab: NEGATIVE

## 2013-10-02 LAB — HEPATITIS B SURFACE ANTIGEN: Hepatitis B Surface Ag: NEGATIVE

## 2013-10-02 LAB — HEPATITIS B SURFACE ANTIBODY,QUALITATIVE: Hep B S Ab: NEGATIVE

## 2013-10-02 MED ORDER — ANASTROZOLE 1 MG PO TABS: 1.0000 mg | ORAL_TABLET | Freq: Every day | ORAL | Status: DC

## 2013-10-02 NOTE — Telephone Encounter (Signed)
per pof to sch pt appt-gave pt copy of sch °

## 2013-10-02 NOTE — Assessment & Plan Note (Addendum)
DCIS left breast status post lumpectomy and radiation, could not tolerate tamoxifen because of superficial venous thrombosis currently on Aromasin.  Elevated ALT and AST and alkaline phosphatase: Ultrasound of the liver showed fatty liver. I discussed with her extensively that it is a reversible condition which can be improved to a combination of diet exercise and consideration for cholesterol medication. I instructed her to see Dr. Felipa Eth and to discuss this issue further.  Today's complete metabolic panel showed normalization of AST ALT and alkaline phosphatase supporting the theory that Aromasin may have caused her problems with liver function. I discussed with her that she may have underlying fatty liver but medications may have made her situation worse.  We will stop Aromasin and used Arimidex and she will come back in a month to see if her liver function remains stable. I still think that she needs to work on exercise controlling her diet and talking to her family physician regarding other measures to improve the fatty liver.

## 2013-10-02 NOTE — Progress Notes (Signed)
Patient Care Team: Lajean Manes, MD as PCP - General (Internal Medicine)  DIAGNOSIS: Neoplasm of left breast, primary tumor staging category Tis: ductal carcinoma in situ (DCIS)   Primary site: Breast (Left)   Staging method: AJCC 7th Edition   Clinical: (Tis (DCIS))   Pathologic free text: [email or web address]   Pathologic: (Tis (DCIS)) signed by Rulon Eisenmenger, MD on 08/28/2013  3:35 PM   Summary: (Tis (DCIS))   SUMMARY OF ONCOLOGIC HISTORY:   Neoplasm of left breast, primary tumor staging category Tis: ductal carcinoma in situ (DCIS)   06/24/2012 Surgery Left breast lumpectomy and sentinel lymph node biopsy DCIS   08/30/2012 - 09/28/2012 Radiation Therapy Radiation to lumpectomy site   10/02/2012 -  Anti-estrogen oral therapy Tamoxifen 20 mg daily started 10/02/2012 discontinued due to superficial leg vein thrombosis that was treated with 3 months of Xarelto. Aromasin 25 mg started July 2015 stopped for diffuse muscle aches, restarted 08/28/2013 to take with Claritin-D    CHIEF COMPLIANT: One-month followup after stopping the Aromasin  INTERVAL HISTORY: Laurie Gomez is a 73 year old Caucasian with above-mentioned history of left breast DCIS who was on Aromasin therapy and developed an increase in ALT AST and alkaline phosphatase. She underwent ultrasound of the liver and is here one month after stopping Aromasin. She reports no new complaints or concerns. She continues to have muscle aches and pains which she previously attributed it to antiestrogen therapy but are probably related to arthritis.  REVIEW OF SYSTEMS:   Constitutional: Denies fevers, chills or abnormal weight loss Eyes: Denies blurriness of vision Ears, nose, mouth, throat, and face: Denies mucositis or sore throat Respiratory: Denies cough, dyspnea or wheezes Cardiovascular: Denies palpitation, chest discomfort or lower extremity swelling Gastrointestinal:  Denies nausea, heartburn or change in bowel habits Skin: Denies abnormal skin  rashes Lymphatics: Denies new lymphadenopathy or easy bruising Neurological:Denies numbness, tingling or new weaknesses Behavioral/Psych: Mood is stable, no new changes  Breast:  denies any pain or lumps or nodules in either breasts All other systems were reviewed with the patient and are negative.  I have reviewed the past medical history, past surgical history, social history and family history with the patient and they are unchanged from previous note.  ALLERGIES:  has no allergies on file.  MEDICATIONS:  Current Outpatient Prescriptions  Medication Sig Dispense Refill  . amlodipine-benazepril (LOTREL) 2.5-10 MG per capsule Take 1 capsule by mouth.      . Calcium Carbonate-Vitamin D 600-125 MG-UNIT TABS Take 1 tablet by mouth.      . levothyroxine (SYNTHROID) 75 MCG tablet TAKE ONE TABLET BY MOUTH ONE TIME DAILY      . Multiple Vitamin (MULTIVITAMIN) tablet Take 1 tablet by mouth.      . triamterene-hydrochlorothiazide (MAXZIDE) 75-50 MG per tablet TAKE 1/2 tablet BY MOUTH ONE TIME DAILY      . vitamin B-12 (CYANOCOBALAMIN) 50 MCG tablet Take 2 tablets by mouth.      Marland Kitchen anastrozole (ARIMIDEX) 1 MG tablet Take 1 tablet (1 mg total) by mouth daily.  30 tablet  0  . exemestane (AROMASIN) 25 MG tablet Take 1 tablet (25 mg total) by mouth daily after breakfast.  90 tablet  3   No current facility-administered medications for this visit.    PHYSICAL EXAMINATION: ECOG PERFORMANCE STATUS: 1 - Symptomatic but completely ambulatory  Filed Vitals:   10/02/13 1041  BP: 128/63  Pulse: 75  Temp: 98.2 F (36.8 C)  Resp: 18   Filed Weights  10/02/13 1041  Weight: 180 lb 9.6 oz (81.92 kg)    GENERAL:alert, no distress and comfortable SKIN: skin color, texture, turgor are normal, no rashes or significant lesions EYES: normal, Conjunctiva are pink and non-injected, sclera clear OROPHARYNX:no exudate, no erythema and lips, buccal mucosa, and tongue normal  NECK: supple, thyroid normal  size, non-tender, without nodularity LYMPH:  no palpable lymphadenopathy in the cervical, axillary or inguinal LUNGS: clear to auscultation and percussion with normal breathing effort HEART: regular rate & rhythm and no murmurs and no lower extremity edema ABDOMEN:abdomen soft, non-tender and normal bowel sounds Musculoskeletal:no cyanosis of digits and no clubbing  NEURO: alert & oriented x 3 with fluent speech, no focal motor/sensory deficits  LABORATORY DATA:  I have reviewed the data as listed   Chemistry      Component Value Date/Time   NA 138 10/02/2013 1030   K 4.2 10/02/2013 1030   CO2 26 10/02/2013 1030   BUN 21.9 10/02/2013 1030   CREATININE 1.1 10/02/2013 1030      Component Value Date/Time   CALCIUM 9.8 10/02/2013 1030   ALKPHOS 81 10/02/2013 1030   AST 16 10/02/2013 1030   ALT 22 10/02/2013 1030   BILITOT 0.45 10/02/2013 1030       Lab Results  Component Value Date   WBC 7.1 09/04/2013   HGB 13.3 09/04/2013   HCT 40.3 09/04/2013   MCV 88.8 09/04/2013   PLT 229 09/04/2013   NEUTROABS 3.7 09/04/2013     RADIOGRAPHIC STUDIES: I have personally reviewed the radiology reports and agreed with their findings. No results found.   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  Elevated ALT and AST and alkaline phosphatase: Ultrasound of the liver showed fatty liver. I discussed with her extensively that it is a reversible condition which can be improved to a combination of diet exercise and consideration for cholesterol medication. I instructed her to see Dr. Felipa Eth and to discuss this issue further.  Today's complete metabolic panel showed normalization of AST ALT and alkaline phosphatase supporting the theory that Aromasin may have caused her problems with liver  function. I discussed with her that she may have underlying fatty liver but medications may have made her situation worse.  We will stop Aromasin and used Arimidex and she will come back in a month to see if her liver function remains stable. I still think that she needs to work on exercise controlling her diet and talking to her family physician regarding other measures to improve the fatty liver.   Orders Placed This Encounter  Procedures  . Comprehensive metabolic panel (Cmet) - [facility]    Standing Status: Future     Number of Occurrences:      Standing Expiration Date: 10/02/2014   The patient has a good understanding of the overall plan. she agrees with it. She will call with any problems that may develop before her next visit here.  I spent 20 minutes counseling the patient face to face. The total time spent in the appointment was 25 minutes and more than 50% was on counseling and review of test results    Rulon Eisenmenger, MD 10/02/2013 11:31 AM

## 2013-10-31 ENCOUNTER — Other Ambulatory Visit: Payer: Self-pay | Admitting: *Deleted

## 2013-10-31 DIAGNOSIS — D0512 Intraductal carcinoma in situ of left breast: Secondary | ICD-10-CM

## 2013-11-03 ENCOUNTER — Ambulatory Visit (HOSPITAL_BASED_OUTPATIENT_CLINIC_OR_DEPARTMENT_OTHER): Payer: Medicare Other | Admitting: Hematology and Oncology

## 2013-11-03 ENCOUNTER — Other Ambulatory Visit (HOSPITAL_BASED_OUTPATIENT_CLINIC_OR_DEPARTMENT_OTHER): Payer: Medicare Other

## 2013-11-03 ENCOUNTER — Telehealth: Payer: Self-pay | Admitting: Hematology and Oncology

## 2013-11-03 VITALS — BP 139/63 | HR 64 | Temp 98.8°F | Resp 18 | Ht 64.0 in | Wt 182.4 lb

## 2013-11-03 DIAGNOSIS — D0512 Intraductal carcinoma in situ of left breast: Secondary | ICD-10-CM

## 2013-11-03 LAB — COMPREHENSIVE METABOLIC PANEL (CC13)
ALK PHOS: 70 U/L (ref 40–150)
ALT: 35 U/L (ref 0–55)
AST: 18 U/L (ref 5–34)
Albumin: 3.7 g/dL (ref 3.5–5.0)
Anion Gap: 10 mEq/L (ref 3–11)
BILIRUBIN TOTAL: 0.4 mg/dL (ref 0.20–1.20)
BUN: 20.2 mg/dL (ref 7.0–26.0)
CO2: 24 meq/L (ref 22–29)
Calcium: 9.5 mg/dL (ref 8.4–10.4)
Chloride: 105 mEq/L (ref 98–109)
Creatinine: 1.2 mg/dL — ABNORMAL HIGH (ref 0.6–1.1)
Glucose: 100 mg/dl (ref 70–140)
Potassium: 3.7 mEq/L (ref 3.5–5.1)
SODIUM: 140 meq/L (ref 136–145)
TOTAL PROTEIN: 6.7 g/dL (ref 6.4–8.3)

## 2013-11-03 LAB — CBC WITH DIFFERENTIAL/PLATELET
BASO%: 0.6 % (ref 0.0–2.0)
Basophils Absolute: 0 10*3/uL (ref 0.0–0.1)
EOS%: 1.8 % (ref 0.0–7.0)
Eosinophils Absolute: 0.1 10*3/uL (ref 0.0–0.5)
HCT: 39.3 % (ref 34.8–46.6)
HGB: 13.2 g/dL (ref 11.6–15.9)
LYMPH%: 35.3 % (ref 14.0–49.7)
MCH: 29.8 pg (ref 25.1–34.0)
MCHC: 33.6 g/dL (ref 31.5–36.0)
MCV: 88.7 fL (ref 79.5–101.0)
MONO#: 0.8 10*3/uL (ref 0.1–0.9)
MONO%: 10.6 % (ref 0.0–14.0)
NEUT#: 3.8 10*3/uL (ref 1.5–6.5)
NEUT%: 51.7 % (ref 38.4–76.8)
PLATELETS: 202 10*3/uL (ref 145–400)
RBC: 4.43 10*6/uL (ref 3.70–5.45)
RDW: 13.4 % (ref 11.2–14.5)
WBC: 7.3 10*3/uL (ref 3.9–10.3)
lymph#: 2.6 10*3/uL (ref 0.9–3.3)

## 2013-11-03 MED ORDER — ANASTROZOLE 1 MG PO TABS: 1.0000 mg | ORAL_TABLET | Freq: Every day | ORAL | Status: DC

## 2013-11-03 NOTE — Assessment & Plan Note (Addendum)
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  Elevated ALT and AST and alkaline phosphatase: Ultrasound of the liver showed fatty liver. These abnormal blood test had normalized after stopping Aromasin. So we switched her treatment to Arimidex.she appears to be tolerating this extremely well. Her liver function tests have remained normal.social continued to take them Arimidex for 4 more years to complete 5 years of therapy.  Surveillance: Mammograms to be done in March of every year. Return to clinic in 3 months for followup with labs

## 2013-11-03 NOTE — Progress Notes (Signed)
Patient Care Team: Lajean Manes, MD as PCP - General (Internal Medicine)  DIAGNOSIS: Neoplasm of left breast, primary tumor staging category Tis: ductal carcinoma in situ (DCIS)   Primary site: Breast (Left)   Staging method: AJCC 7th Edition   Clinical: (Tis (DCIS)) - Unsigned   Pathologic free text: [email or web address]   Pathologic: (Tis (DCIS)) - Signed by Rulon Eisenmenger, MD on 08/28/2013   Summary: (Tis (DCIS))   SUMMARY OF ONCOLOGIC HISTORY:   Neoplasm of left breast, primary tumor staging category Tis: ductal carcinoma in situ (DCIS)   06/24/2012 Surgery Left breast lumpectomy and sentinel lymph node biopsy DCIS   08/30/2012 - 09/28/2012 Radiation Therapy Radiation to lumpectomy site   10/02/2012 - 10/02/2013 Anti-estrogen oral therapy Tamoxifen 20 mg daily started 10/02/2012 discontinued due to superficial leg vein thrombosis. Aromasin 25 mg started July 2015 stopped for muscle aches, restarted 08/28/2013 stopped in 10/02/13 due to elevation AST adn ALT   10/02/2013 -  Anti-estrogen oral therapy Arimidex 1 mg daily    CHIEF COMPLIANT: followup on Arimidex therapy  INTERVAL HISTORY: Laurie Gomez is a29 year old Caucasian with above-mentioned history of left breast DCIS treated with lumpectomy and radiation. She tried tamoxifen and Aromasin. We finally switched to Arimidex. She is tolerating this fairly well. She has to wake up couple of times at night. And she has  slightly hard time getting back to sleep.also complaining of mild constipation. But she does not have muscle aches or pains.  REVIEW OF SYSTEMS:   Constitutional: Denies fevers, chills or abnormal weight loss Eyes: Denies blurriness of vision Ears, nose, mouth, throat, and face: Denies mucositis or sore throat Respiratory: Denies cough, dyspnea or wheezes Cardiovascular: Denies palpitation, chest discomfort or lower extremity swelling Gastrointestinal:  Denies nausea, heartburn or change in bowel habits Skin: Denies abnormal skin  rashes Lymphatics: Denies new lymphadenopathy or easy bruising Neurological:Denies numbness, tingling or new weaknesses Behavioral/Psych: Mood is stable, no new changes  Breast:  denies any pain or lumps or nodules in either breasts All other systems were reviewed with the patient and are negative.  I have reviewed the past medical history, past surgical history, social history and family history with the patient and they are unchanged from previous note.  ALLERGIES:  has no allergies on file.  MEDICATIONS:  Current Outpatient Prescriptions  Medication Sig Dispense Refill  . amlodipine-benazepril (LOTREL) 2.5-10 MG per capsule Take 1 capsule by mouth.    Marland Kitchen anastrozole (ARIMIDEX) 1 MG tablet     . Calcium Carbonate-Vitamin D 600-125 MG-UNIT TABS Take 1 tablet by mouth.    . levothyroxine (SYNTHROID) 75 MCG tablet TAKE ONE TABLET BY MOUTH ONE TIME DAILY    . Multiple Vitamin (MULTIVITAMIN) tablet Take 1 tablet by mouth.    . triamterene-hydrochlorothiazide (MAXZIDE) 75-50 MG per tablet TAKE 1/2 tablet BY MOUTH ONE TIME DAILY    . vitamin B-12 (CYANOCOBALAMIN) 50 MCG tablet Take 2 tablets by mouth.    Marland Kitchen anastrozole (ARIMIDEX) 1 MG tablet Take 1 tablet (1 mg total) by mouth daily. 90 tablet 6   No current facility-administered medications for this visit.    PHYSICAL EXAMINATION: ECOG PERFORMANCE STATUS: 0 - Asymptomatic  Filed Vitals:   11/03/13 0838  BP: 139/63  Pulse: 64  Temp: 98.8 F (37.1 C)  Resp: 18   Filed Weights   11/03/13 0838  Weight: 182 lb 6.4 oz (82.736 kg)    GENERAL:alert, no distress and comfortable SKIN: skin color, texture, turgor are normal, no rashes or  significant lesions EYES: normal, Conjunctiva are pink and non-injected, sclera clear OROPHARYNX:no exudate, no erythema and lips, buccal mucosa, and tongue normal  NECK: supple, thyroid normal size, non-tender, without nodularity LYMPH:  no palpable lymphadenopathy in the cervical, axillary or  inguinal LUNGS: clear to auscultation and percussion with normal breathing effort HEART: regular rate & rhythm and no murmurs and no lower extremity edema ABDOMEN:abdomen soft, non-tender and normal bowel sounds Musculoskeletal:no cyanosis of digits and no clubbing  NEURO: alert & oriented x 3 with fluent speech, no focal motor/sensory deficits  LABORATORY DATA:  I have reviewed the data as listed   Chemistry      Component Value Date/Time   NA 140 11/03/2013 0758   K 3.7 11/03/2013 0758   CO2 24 11/03/2013 0758   BUN 20.2 11/03/2013 0758   CREATININE 1.2* 11/03/2013 0758      Component Value Date/Time   CALCIUM 9.5 11/03/2013 0758   ALKPHOS 70 11/03/2013 0758   AST 18 11/03/2013 0758   ALT 35 11/03/2013 0758   BILITOT 0.40 11/03/2013 0758       Lab Results  Component Value Date   WBC 7.3 11/03/2013   HGB 13.2 11/03/2013   HCT 39.3 11/03/2013   MCV 88.7 11/03/2013   PLT 202 11/03/2013   NEUTROABS 3.8 11/03/2013     RADIOGRAPHIC STUDIES: I have personally reviewed the radiology reports and agreed with their findings. No results found.   ASSESSMENT & PLAN:  Neoplasm of left breast, primary tumor staging category Tis: ductal carcinoma in situ (DCIS) DCIS left breast status post lumpectomy and radiation, could not tolerate tamoxifen because of superficial venous thrombosis currently on Arimidex.  Elevated ALT and AST and alkaline phosphatase: Ultrasound of the liver showed fatty liver. These abnormal blood test had normalized after stopping Aromasin. So we switched her treatment to Arimidex.she appears to be tolerating this extremely well. Her liver function tests have remained normal.social continued to take them Arimidex for 4 more years to complete 5 years of therapy.  Surveillance: Mammograms to be done in March of every year. Return to clinic in 3 months for followup with labs   Orders Placed This Encounter  Procedures  . CBC with Differential    Standing  Status: Future     Number of Occurrences:      Standing Expiration Date: 11/03/2014  . Comprehensive metabolic panel (Cmet) - [facility]    Standing Status: Future     Number of Occurrences:      Standing Expiration Date: 11/03/2014   The patient has a good understanding of the overall plan. she agrees with it. She will call with any problems that may develop before her next visit here.  I spent 15 minutes counseling the patient face to face. The total time spent in the appointment was 20 minutes and more than 50% was on counseling and review of test results    Rulon Eisenmenger, MD 11/03/2013 8:57 AM

## 2013-12-19 ENCOUNTER — Encounter: Payer: Self-pay | Admitting: Hematology and Oncology

## 2013-12-25 ENCOUNTER — Encounter (HOSPITAL_COMMUNITY): Payer: Self-pay | Admitting: *Deleted

## 2013-12-25 ENCOUNTER — Inpatient Hospital Stay (HOSPITAL_COMMUNITY)
Admission: EM | Admit: 2013-12-25 | Discharge: 2013-12-26 | DRG: 392 | Disposition: A | Discharge status: Home / Self Care | Payer: Medicare Other | Attending: Internal Medicine | Admitting: Internal Medicine

## 2013-12-25 ENCOUNTER — Emergency Department (HOSPITAL_COMMUNITY): Payer: Medicare Other

## 2013-12-25 DIAGNOSIS — M810 Age-related osteoporosis without current pathological fracture: Secondary | ICD-10-CM | POA: Diagnosis present

## 2013-12-25 DIAGNOSIS — F1099 Alcohol use, unspecified with unspecified alcohol-induced disorder: Secondary | ICD-10-CM | POA: Diagnosis present

## 2013-12-25 DIAGNOSIS — Z803 Family history of malignant neoplasm of breast: Secondary | ICD-10-CM

## 2013-12-25 DIAGNOSIS — R109 Unspecified abdominal pain: Secondary | ICD-10-CM | POA: Diagnosis not present

## 2013-12-25 DIAGNOSIS — I10 Essential (primary) hypertension: Secondary | ICD-10-CM | POA: Diagnosis present

## 2013-12-25 DIAGNOSIS — K5720 Diverticulitis of large intestine with perforation and abscess without bleeding: Principal | ICD-10-CM | POA: Diagnosis present

## 2013-12-25 DIAGNOSIS — R1032 Left lower quadrant pain: Secondary | ICD-10-CM | POA: Diagnosis not present

## 2013-12-25 DIAGNOSIS — K5792 Diverticulitis of intestine, part unspecified, without perforation or abscess without bleeding: Secondary | ICD-10-CM | POA: Diagnosis present

## 2013-12-25 DIAGNOSIS — D72829 Elevated white blood cell count, unspecified: Secondary | ICD-10-CM | POA: Diagnosis present

## 2013-12-25 DIAGNOSIS — Z86010 Personal history of colonic polyps: Secondary | ICD-10-CM | POA: Diagnosis not present

## 2013-12-25 DIAGNOSIS — R1030 Lower abdominal pain, unspecified: Secondary | ICD-10-CM | POA: Diagnosis not present

## 2013-12-25 DIAGNOSIS — Z79899 Other long term (current) drug therapy: Secondary | ICD-10-CM

## 2013-12-25 DIAGNOSIS — Z853 Personal history of malignant neoplasm of breast: Secondary | ICD-10-CM | POA: Diagnosis not present

## 2013-12-25 LAB — COMPREHENSIVE METABOLIC PANEL
ALBUMIN: 3.8 g/dL (ref 3.5–5.2)
ALT: 19 U/L (ref 0–35)
ANION GAP: 7 (ref 5–15)
AST: 28 U/L (ref 0–37)
Alkaline Phosphatase: 69 U/L (ref 39–117)
BUN: 22 mg/dL (ref 6–23)
CALCIUM: 9.2 mg/dL (ref 8.4–10.5)
CO2: 25 mmol/L (ref 19–32)
CREATININE: 1.14 mg/dL — AB (ref 0.50–1.10)
Chloride: 107 mEq/L (ref 96–112)
GFR calc Af Amer: 55 mL/min — ABNORMAL LOW (ref >90)
GFR calc non Af Amer: 47 mL/min — ABNORMAL LOW (ref >90)
Glucose, Bld: 124 mg/dL — ABNORMAL HIGH (ref 70–99)
Potassium: 4.6 mmol/L (ref 3.5–5.1)
Sodium: 139 mmol/L (ref 135–145)
Total Bilirubin: 1.4 mg/dL — ABNORMAL HIGH (ref 0.3–1.2)
Total Protein: 7 g/dL (ref 6.0–8.3)

## 2013-12-25 LAB — LIPASE, BLOOD: Lipase: 21 U/L (ref 11–59)

## 2013-12-25 LAB — CBC WITH DIFFERENTIAL/PLATELET
Basophils Absolute: 0 K/uL (ref 0.0–0.1)
Basophils Relative: 0 % (ref 0–1)
Eosinophils Absolute: 0.1 K/uL (ref 0.0–0.7)
Eosinophils Relative: 1 % (ref 0–5)
HCT: 38.7 % (ref 36.0–46.0)
Hemoglobin: 12.9 g/dL (ref 12.0–15.0)
Lymphocytes Relative: 18 % (ref 12–46)
Lymphs Abs: 2.1 K/uL (ref 0.7–4.0)
MCH: 29.8 pg (ref 26.0–34.0)
MCHC: 33.3 g/dL (ref 30.0–36.0)
MCV: 89.4 fL (ref 78.0–100.0)
Monocytes Absolute: 0.9 K/uL (ref 0.1–1.0)
Monocytes Relative: 8 % (ref 3–12)
Neutro Abs: 8.6 K/uL — ABNORMAL HIGH (ref 1.7–7.7)
Neutrophils Relative %: 73 % (ref 43–77)
Platelets: 208 K/uL (ref 150–400)
RBC: 4.33 MIL/uL (ref 3.87–5.11)
RDW: 12.9 % (ref 11.5–15.5)
WBC: 11.7 K/uL — ABNORMAL HIGH (ref 4.0–10.5)

## 2013-12-25 LAB — URINE MICROSCOPIC-ADD ON

## 2013-12-25 LAB — URINALYSIS, ROUTINE W REFLEX MICROSCOPIC
Bilirubin Urine: NEGATIVE
Glucose, UA: NEGATIVE mg/dL
Hgb urine dipstick: NEGATIVE
KETONES UR: NEGATIVE mg/dL
Nitrite: NEGATIVE
PH: 6 (ref 5.0–8.0)
Protein, ur: NEGATIVE mg/dL
Specific Gravity, Urine: 1.023 (ref 1.005–1.030)
UROBILINOGEN UA: 0.2 mg/dL (ref 0.0–1.0)

## 2013-12-25 MED ORDER — TRIAMTERENE-HCTZ 75-50 MG PO TABS
0.5000 | ORAL_TABLET | Freq: Every day | ORAL | Status: DC
  Filled 2013-12-25: qty 0.5

## 2013-12-25 MED ORDER — PIPERACILLIN-TAZOBACTAM 3.375 G IVPB
3.3750 g | Freq: Three times a day (TID) | INTRAVENOUS | Status: DC
  Administered 2013-12-25 – 2013-12-26 (×3): 3.375 g via INTRAVENOUS
  Filled 2013-12-25 (×5): qty 50

## 2013-12-25 MED ORDER — FENTANYL CITRATE 0.05 MG/ML IJ SOLN
100.0000 ug | Freq: Once | INTRAMUSCULAR | Status: AC
  Administered 2013-12-25: 100 ug via INTRAVENOUS
  Filled 2013-12-25: qty 2

## 2013-12-25 MED ORDER — ANASTROZOLE 1 MG PO TABS
1.0000 mg | ORAL_TABLET | Freq: Every day | ORAL | Status: DC
  Administered 2013-12-25 – 2013-12-26 (×2): 1 mg via ORAL
  Filled 2013-12-25 (×2): qty 1

## 2013-12-25 MED ORDER — PIPERACILLIN-TAZOBACTAM 3.375 G IVPB 30 MIN: 3.3750 g | Freq: Three times a day (TID) | INTRAVENOUS | Status: DC

## 2013-12-25 MED ORDER — SODIUM CHLORIDE 0.9 % IV SOLN
INTRAVENOUS | Status: DC
  Administered 2013-12-25 (×2): via INTRAVENOUS

## 2013-12-25 MED ORDER — ADULT MULTIVITAMIN W/MINERALS CH
1.0000 | ORAL_TABLET | Freq: Every day | ORAL | Status: DC
  Administered 2013-12-25 – 2013-12-26 (×2): 1 via ORAL
  Filled 2013-12-25 (×2): qty 1

## 2013-12-25 MED ORDER — CALCIUM CARBONATE-VITAMIN D 600-125 MG-UNIT PO TABS: 1.0000 | ORAL_TABLET | Freq: Every day | ORAL | Status: DC

## 2013-12-25 MED ORDER — METRONIDAZOLE IN NACL 5-0.79 MG/ML-% IV SOLN
500.0000 mg | Freq: Three times a day (TID) | INTRAVENOUS | Status: DC
  Filled 2013-12-25: qty 100

## 2013-12-25 MED ORDER — ONDANSETRON HCL 4 MG/2ML IJ SOLN
4.0000 mg | Freq: Once | INTRAMUSCULAR | Status: AC
  Administered 2013-12-25: 4 mg via INTRAVENOUS
  Filled 2013-12-25: qty 2

## 2013-12-25 MED ORDER — CALCIUM CARBONATE-VITAMIN D 500-200 MG-UNIT PO TABS
2.0000 | ORAL_TABLET | Freq: Every day | ORAL | Status: DC
  Administered 2013-12-25: 2 via ORAL
  Filled 2013-12-25 (×2): qty 2

## 2013-12-25 MED ORDER — PIPERACILLIN-TAZOBACTAM 4.5 G IVPB: 4.5000 g | Freq: Once | INTRAVENOUS | Status: DC

## 2013-12-25 MED ORDER — PIPERACILLIN-TAZOBACTAM 3.375 G IVPB 30 MIN
3.3750 g | INTRAVENOUS | Status: AC
  Administered 2013-12-25: 3.375 g via INTRAVENOUS
  Filled 2013-12-25: qty 50

## 2013-12-25 MED ORDER — LEVOTHYROXINE SODIUM 75 MCG PO TABS
75.0000 ug | ORAL_TABLET | Freq: Every day | ORAL | Status: DC
  Administered 2013-12-25 – 2013-12-26 (×2): 75 ug via ORAL
  Filled 2013-12-25 (×3): qty 1

## 2013-12-25 MED ORDER — IOHEXOL 300 MG/ML  SOLN
50.0000 mL | Freq: Once | INTRAMUSCULAR | Status: AC | PRN
  Administered 2013-12-25: 50 mL via ORAL

## 2013-12-25 MED ORDER — METRONIDAZOLE IN NACL 5-0.79 MG/ML-% IV SOLN
500.0000 mg | Freq: Three times a day (TID) | INTRAVENOUS | Status: DC
  Administered 2013-12-25: 500 mg via INTRAVENOUS
  Filled 2013-12-25: qty 100

## 2013-12-25 MED ORDER — BENAZEPRIL HCL 10 MG PO TABS
10.0000 mg | ORAL_TABLET | Freq: Every day | ORAL | Status: DC
  Administered 2013-12-25 – 2013-12-26 (×2): 10 mg via ORAL
  Filled 2013-12-25 (×2): qty 1

## 2013-12-25 MED ORDER — AMLODIPINE BESY-BENAZEPRIL HCL 2.5-10 MG PO CAPS: 1.0000 | ORAL_CAPSULE | Freq: Every day | ORAL | Status: DC

## 2013-12-25 MED ORDER — CIPROFLOXACIN IN D5W 400 MG/200ML IV SOLN
400.0000 mg | Freq: Two times a day (BID) | INTRAVENOUS | Status: DC
  Filled 2013-12-25: qty 200

## 2013-12-25 MED ORDER — HYDROCODONE-ACETAMINOPHEN 5-325 MG PO TABS
1.0000 | ORAL_TABLET | Freq: Four times a day (QID) | ORAL | Status: DC | PRN
  Administered 2013-12-25: 2 via ORAL
  Filled 2013-12-25: qty 2

## 2013-12-25 MED ORDER — CALCIUM CARBONATE-VITAMIN D 500-200 MG-UNIT PO TABS
1.0000 | ORAL_TABLET | Freq: Every day | ORAL | Status: DC
  Administered 2013-12-26: 1 via ORAL
  Filled 2013-12-25 (×2): qty 1

## 2013-12-25 MED ORDER — AMLODIPINE BESYLATE 2.5 MG PO TABS
2.5000 mg | ORAL_TABLET | Freq: Every day | ORAL | Status: DC
  Administered 2013-12-25 – 2013-12-26 (×2): 2.5 mg via ORAL
  Filled 2013-12-25 (×2): qty 1

## 2013-12-25 MED ORDER — VITAMIN B-12 100 MCG PO TABS
100.0000 ug | ORAL_TABLET | Freq: Every day | ORAL | Status: DC
  Administered 2013-12-25 – 2013-12-26 (×2): 100 ug via ORAL
  Filled 2013-12-25 (×2): qty 1

## 2013-12-25 MED ORDER — IOHEXOL 300 MG/ML  SOLN
100.0000 mL | Freq: Once | INTRAMUSCULAR | Status: AC | PRN
  Administered 2013-12-25: 100 mL via INTRAVENOUS

## 2013-12-25 MED ORDER — CIPROFLOXACIN IN D5W 400 MG/200ML IV SOLN
400.0000 mg | Freq: Two times a day (BID) | INTRAVENOUS | Status: DC
  Administered 2013-12-25: 400 mg via INTRAVENOUS
  Filled 2013-12-25: qty 200

## 2013-12-25 MED ORDER — TRIAMTERENE-HCTZ 37.5-25 MG PO TABS
1.0000 | ORAL_TABLET | Freq: Every day | ORAL | Status: DC
  Administered 2013-12-25: 1 via ORAL
  Filled 2013-12-25: qty 1

## 2013-12-25 MED ORDER — HEPARIN SODIUM (PORCINE) 5000 UNIT/ML IJ SOLN
5000.0000 [IU] | Freq: Three times a day (TID) | INTRAMUSCULAR | Status: DC
  Administered 2013-12-25 – 2013-12-26 (×4): 5000 [IU] via SUBCUTANEOUS
  Filled 2013-12-25 (×7): qty 1

## 2013-12-25 NOTE — Progress Notes (Signed)
ANTIBIOTIC CONSULT NOTE - INITIAL  Pharmacy Consult for Zosyn Indication: acute diverticulitis with microperforation  No Known Allergies  Patient Measurements: Height: 5\' 5"  (165.1 cm) Weight: 183 lb 4.8 oz (83.144 kg) IBW/kg (Calculated) : 57   Vital Signs: Temp: 98.9 F (37.2 C) (12/24 0838) Temp Source: Oral (12/24 0838) BP: 129/56 mmHg (12/24 0838) Pulse Rate: 78 (12/24 0838) Intake/Output from previous day:   Intake/Output from this shift: Total I/O In: 120 [P.O.:120] Out: -   Labs:  Recent Labs  12/25/13 0252  WBC 11.7*  HGB 12.9  PLT 208  CREATININE 1.14*   Estimated Creatinine Clearance: 48.2 mL/min (by C-G formula based on Cr of 1.14).    Medical History: Past Medical History  Diagnosis Date  . Breast cancer   . Osteoporosis   . superifical thrombosis   . Colon polyps   . Hemorrhoids   . Edema   . Hypertension     Microbiology: No cultures yet this admission  Anti-infectives: 12/24>>Zosyn x 1 dose in ED 12/24>>Cipro>>12/24 12/24>>Flagyl>>12/24 12/24>>Zosyn  Assessment: 73 y/o F presented with abdominal pain and admitted with diverticulitis and microperforation.  Received IV Zosyn x 1 dose in ED then was placed on IV Cipro/Flagyl.  Orders now received to switch back to Zosyn with pharmacy dosing assistance.   Goal of Therapy:  Appropriate antibiotic dosing for indication and renal function; eradication of infection.   Plan:  1. Zosyn 3.375 grams IV q8h (extended infusion, each dose over 4 hours) 2. Follow clinical course, renal function.  Clayburn Pert, PharmD, BCPS Pager: [phone] 12/25/2013  12:45 PM

## 2013-12-25 NOTE — H&P (Signed)
[facility] History and Physical  Dareen Gutzwiller OZD:[id number] DOB: 11-24-1940 DOA: 12/25/2013  Referring physician: EDP PCP: Mathews Argyle, MD   Chief Complaint: Abdominal pain   HPI: Laurie Gomez is a 73 y.o. female who presents to the ED with a h/o abdominal cramping.  Pain is located in suprapubic and LLQ areas.  It is associated with NB diarrhea.  Mild nausea but no vomiting.  Has chills but no fever she states.  Quality is "like severe menstural cramps".  Intermittent in timing initially, but constant since 6pm yesterday.  Review of Systems: Systems reviewed.  As above, otherwise negative  Past Medical History  Diagnosis Date  . Breast cancer   . Osteoporosis   . superifical thrombosis   . Colon polyps   . Hemorrhoids   . Edema   . Hypertension    Past Surgical History  Procedure Laterality Date  . Thyroidectomy, partial  2000  . Breast lumpectomy  2014  . Tubal ligation  1975  . Dilatation & curettage/hysteroscopy with trueclear  1975   Social History:  reports that she has never smoked. She has never used smokeless tobacco. She reports that she drinks alcohol. She reports that she does not use illicit drugs.  No Known Allergies  Family History  Problem Relation Age of Onset  . Cancer Sister 91    Breast cancer  . Cancer Cousin 64    Breast cancer     Prior to Admission medications   Medication Sig Start Date End Date Taking? Authorizing Provider  amlodipine-benazepril (LOTREL) 2.5-10 MG per capsule Take 1 capsule by mouth daily.  11/06/12 12/25/13 Yes Historical Provider, MD  anastrozole (ARIMIDEX) 1 MG tablet Take 1 tablet (1 mg total) by mouth daily. 11/03/13  Yes Rulon Eisenmenger, MD  Calcium Carbonate-Vitamin D 600-125 MG-UNIT TABS Take 1 tablet by mouth daily.    Yes Historical Provider, MD  levothyroxine (SYNTHROID) 75 MCG tablet Take 75 mcg by mouth daily before breakfast.  01/21/13  Yes Historical Provider, MD  Multiple Vitamin  (MULTIVITAMIN) tablet Take 1 tablet by mouth daily.    Yes Historical Provider, MD  triamterene-hydrochlorothiazide (MAXZIDE) 75-50 MG per tablet Take 0.5 tablets by mouth daily.  11/06/12  Yes Historical Provider, MD  vitamin B-12 (CYANOCOBALAMIN) 50 MCG tablet Take 2 tablets by mouth daily.    Yes Historical Provider, MD   Physical Exam: Filed Vitals:   12/25/13 0211  BP: 157/91  Pulse: 116  Temp: 98.2 F (36.8 C)  Resp: 18    BP 157/91 mmHg  Pulse 116  Temp(Src) 98.2 F (36.8 C) (Oral)  Resp 18  SpO2 100%  General Appearance:    Alert, oriented, no distress, appears stated age  Head:    Normocephalic, atraumatic  Eyes:    PERRL, EOMI, sclera non-icteric        Nose:   Nares without drainage or epistaxis. Mucosa, turbinates normal  Throat:   Moist mucous membranes. Oropharynx without erythema or exudate.  Neck:   Supple. No carotid bruits.  No thyromegaly.  No lymphadenopathy.   Back:     No CVA tenderness, no spinal tenderness  Lungs:     Clear to auscultation bilaterally, without wheezes, rhonchi or rales  Chest wall:    No tenderness to palpitation  Heart:    Regular rate and rhythm without murmurs, gallops, rubs  Abdomen:     Soft, non-tender, nondistended, normal bowel sounds, no organomegaly  Genitalia:    deferred  Rectal:  deferred  Extremities:   No clubbing, cyanosis or edema.  Pulses:   2+ and symmetric all extremities  Skin:   Skin color, texture, turgor normal, no rashes or lesions  Lymph nodes:   Cervical, supraclavicular, and axillary nodes normal  Neurologic:   CNII-XII intact. Normal strength, sensation and reflexes      throughout    Labs on Admission:  Basic Metabolic Panel:  Recent Labs Lab 12/25/13 0252  NA 139  K 4.6  CL 107  CO2 25  GLUCOSE 124*  BUN 22  CREATININE 1.14*  CALCIUM 9.2   Liver Function Tests:  Recent Labs Lab 12/25/13 0252  AST 28  ALT 19  ALKPHOS 69  BILITOT 1.4*  PROT 7.0  ALBUMIN 3.8    Recent Labs Lab  12/25/13 0252  LIPASE 21   No results for input(s): AMMONIA in the last 168 hours. CBC:  Recent Labs Lab 12/25/13 0252  WBC 11.7*  NEUTROABS 8.6*  HGB 12.9  HCT 38.7  MCV 89.4  PLT 208   Cardiac Enzymes: No results for input(s): CKTOTAL, CKMB, CKMBINDEX, TROPONINI in the last 168 hours.  BNP (last 3 results) No results for input(s): PROBNP in the last 8760 hours. CBG: No results for input(s): GLUCAP in the last 168 hours.  Radiological Exams on Admission: Ct Abdomen Pelvis W Contrast  12/25/2013   CLINICAL DATA:  Initial evaluation for lower abdominal cramping. Leukocytosis.  EXAM: CT ABDOMEN AND PELVIS WITH CONTRAST  TECHNIQUE: Multidetector CT imaging of the abdomen and pelvis was performed using the standard protocol following bolus administration of intravenous contrast.  CONTRAST:  128mL OMNIPAQUE IOHEXOL 300 MG/ML  SOLN  COMPARISON:  Prior ultrasound from 09/09/2013.  FINDINGS: The visualized lung bases are clear. No pleural or pericardial effusion.  The liver demonstrates a normal contrast enhanced appearance. Gallbladder within normal limits. No biliary dilatation. The spleen, adrenal glands, and pancreas demonstrate a normal contrast enhanced appearance.  Multiple parapelvic cysts noted within the left kidney. Kidneys are otherwise unremarkable without evidence of nephrolithiasis or hydronephrosis. No focal enhancing renal mass.  Stomach within normal limits. No evidence for bowel obstruction. There is an inflamed diverticula in the sigmoid colon with associated inflammatory fat stranding and wall thickening, consistent with acute diverticulitis. Small amount of free fluid present within the pelvis, likely reactive. There is a small focus of gas within this region that is suspicious for lying and extraluminal location (series 2, image 60). Finding suggests of associated micro perforation. No diverticular abscess.  No other acute inflammatory changes seen about the bowels.  Appendix is normal.  Bladder within normal limits.  Uterus and ovaries are normal.  No pathologically enlarged intra-abdominal pelvic lymph nodes.  No acute osseous abnormality. No worrisome lytic or blastic osseous lesions. Suspicious for associated micro perforation (series 2, image 60). No diverticular abscess.  IMPRESSION: Findings consistent with acute sigmoid diverticulitis. There are a few small foci of gas in the region of inflammation better not definitely intraluminal in location, suspicious for possible associated micro perforation. No diverticular abscess identified.  Critical Value/emergent results were called by telephone at the time of interpretation on 12/25/2013 at 5:38 am to Dr. Shanon Rosser , who verbally acknowledged these results.   Electronically Signed   By: Jeannine Boga M.D.   On: 12/25/2013 05:48    EKG: Independently reviewed.  Assessment/Plan Principal Problem:   Acute diverticulitis   1. Acute uncomplicated diverticulitis - micro perf on CT abd pelvis, gave patient option of inpatient  treatment vs outpatient treatment.  Patient would like to start off as inpatient. 1. One dose of zosyn in ED 2. Ordering cipro / flagyl IV 3. Clear liquid diet 4. Pain control with norco (has mild pain at this point and has not required pain meds for several hours in ED since initial doses of fentanyl), increase pain meds to IV if needed. 5. Repeat labs tomorrow AM, leukocytosis is very mild at 11.7k    Code Status: Full Code  Family Communication: Husband at bedside Disposition Plan: Admit to inpatient  Time spent: 70 min  GARDNER, JARED M. [facility] Pager [phone]  If 7AM-7PM, please contact the day team taking care of the patient [email or web address] Password [email or web address] 12/25/2013, 6:28 AM

## 2013-12-25 NOTE — ED Notes (Signed)
Pt reports lower abd cramping tonight.  Denies n/v/d at this time

## 2013-12-25 NOTE — Progress Notes (Signed)
Patient seen and examined Subjective denies any nausea vomiting abdominal pain  Assessment and plan Acute diverticulitis with microperforation Would recommend continuation of IV Zosyn Discontinue Cipro and Flagyl If symptoms do not improve consider surgical consultation Clear liquid diet Aggressive IV hydration  Hypertension Discontinue Maxzide

## 2013-12-25 NOTE — ED Provider Notes (Signed)
CSN: [id number]     Arrival date & time 12/25/13  0204 History   First MD Initiated Contact with Patient 12/25/13 0216     Chief Complaint  Patient presents with  . Abdominal Cramping     (Consider location/radiation/quality/duration/timing/severity/associated sxs/prior Treatment) HPI  This is a 73 year old female with about a 24-hour history of abdominal cramping. The pain is located in the suprapubic and left lower quadrants. It began intermittently and mild but has been constant, but when exiting and waning, since about 6 PM yesterday evening. She describes it as like severe menstrual cramps. It is been associated with diarrhea which is nonbloody. She has some mild nausea but no vomiting. She denies fever but has had chills.  Past Medical History  Diagnosis Date  . Breast cancer   . Osteoporosis   . superifical thrombosis   . Colon polyps   . Hemorrhoids   . Edema   . Hypertension    Past Surgical History  Procedure Laterality Date  . Thyroidectomy, partial  2000  . Breast lumpectomy  2014  . Tubal ligation  1975  . Dilatation & curettage/hysteroscopy with trueclear  1975   Family History  Problem Relation Age of Onset  . Cancer Sister 46    Breast cancer  . Cancer Cousin 64    Breast cancer   History  Substance Use Topics  . Smoking status: Never Smoker   . Smokeless tobacco: Never Used  . Alcohol Use: Yes   OB History    No data available     Review of Systems  All other systems reviewed and are negative.   Allergies  Review of patient's allergies indicates no known allergies.  Home Medications   Prior to Admission medications   Medication Sig Start Date End Date Taking? Authorizing Provider  amlodipine-benazepril (LOTREL) 2.5-10 MG per capsule Take 1 capsule by mouth daily.  11/06/12 12/25/13 Yes Historical Provider, MD  anastrozole (ARIMIDEX) 1 MG tablet Take 1 tablet (1 mg total) by mouth daily. 11/03/13  Yes Rulon Eisenmenger, MD  Calcium  Carbonate-Vitamin D 600-125 MG-UNIT TABS Take 1 tablet by mouth daily.    Yes Historical Provider, MD  levothyroxine (SYNTHROID) 75 MCG tablet Take 75 mcg by mouth daily before breakfast.  01/21/13  Yes Historical Provider, MD  Multiple Vitamin (MULTIVITAMIN) tablet Take 1 tablet by mouth daily.    Yes Historical Provider, MD  triamterene-hydrochlorothiazide (MAXZIDE) 75-50 MG per tablet Take 0.5 tablets by mouth daily.  11/06/12  Yes Historical Provider, MD  vitamin B-12 (CYANOCOBALAMIN) 50 MCG tablet Take 2 tablets by mouth daily.    Yes Historical Provider, MD   BP 157/91 mmHg  Pulse 116  Temp(Src) 98.2 F (36.8 C) (Oral)  Resp 18  SpO2 100%   Physical Exam  General: Well-developed, well-nourished female in no acute distress; appearance consistent with age of record HENT: normocephalic; atraumatic Eyes: pupils equal, round and reactive to light; extraocular muscles intact Neck: supple Heart: regular rate and rhythm; faint systolic murmur loudest at the right lower sternal border Lungs: clear to auscultation bilaterally Abdomen: soft; nondistended; suprapubic and left lower quadrant tenderness; no masses or hepatosplenomegaly; bowel sounds present Extremities: No deformity; full range of motion; pulses normal; trace edema of lower legs Neurologic: Awake, alert and oriented; motor function intact in all extremities and symmetric; no facial droop Skin: Warm and dry Psychiatric: Normal mood and affect    ED Course  Procedures (including critical care time)   MDM  Nursing notes  and vitals signs, including pulse oximetry, reviewed.  Summary of this visit's results, reviewed by myself:  Labs:  Results for orders placed or performed during the hospital encounter of 12/25/13 (from the past 24 hour(s))  Urinalysis, Routine w reflex microscopic     Status: Abnormal   Collection Time: 12/25/13  2:27 AM  Result Value Ref Range   Color, Urine YELLOW YELLOW   APPearance CLEAR CLEAR    Specific Gravity, Urine 1.023 1.005 - 1.030   pH 6.0 5.0 - 8.0   Glucose, UA NEGATIVE NEGATIVE mg/dL   Hgb urine dipstick NEGATIVE NEGATIVE   Bilirubin Urine NEGATIVE NEGATIVE   Ketones, ur NEGATIVE NEGATIVE mg/dL   Protein, ur NEGATIVE NEGATIVE mg/dL   Urobilinogen, UA 0.2 0.0 - 1.0 mg/dL   Nitrite NEGATIVE NEGATIVE   Leukocytes, UA MODERATE (A) NEGATIVE  Urine microscopic-add on     Status: Abnormal   Collection Time: 12/25/13  2:27 AM  Result Value Ref Range   Squamous Epithelial / LPF RARE RARE   WBC, UA 3-6 <3 WBC/hpf   Bacteria, UA FEW (A) RARE  CBC with Differential     Status: Abnormal   Collection Time: 12/25/13  2:52 AM  Result Value Ref Range   WBC 11.7 (H) 4.0 - 10.5 K/uL   RBC 4.33 3.87 - 5.11 MIL/uL   Hemoglobin 12.9 12.0 - 15.0 g/dL   HCT 38.7 36.0 - 46.0 %   MCV 89.4 78.0 - 100.0 fL   MCH 29.8 26.0 - 34.0 pg   MCHC 33.3 30.0 - 36.0 g/dL   RDW 12.9 11.5 - 15.5 %   Platelets 208 150 - 400 K/uL   Neutrophils Relative % 73 43 - 77 %   Neutro Abs 8.6 (H) 1.7 - 7.7 K/uL   Lymphocytes Relative 18 12 - 46 %   Lymphs Abs 2.1 0.7 - 4.0 K/uL   Monocytes Relative 8 3 - 12 %   Monocytes Absolute 0.9 0.1 - 1.0 K/uL   Eosinophils Relative 1 0 - 5 %   Eosinophils Absolute 0.1 0.0 - 0.7 K/uL   Basophils Relative 0 0 - 1 %   Basophils Absolute 0.0 0.0 - 0.1 K/uL  Comprehensive metabolic panel     Status: Abnormal   Collection Time: 12/25/13  2:52 AM  Result Value Ref Range   Sodium 139 135 - 145 mmol/L   Potassium 4.6 3.5 - 5.1 mmol/L   Chloride 107 96 - 112 mEq/L   CO2 25 19 - 32 mmol/L   Glucose, Bld 124 (H) 70 - 99 mg/dL   BUN 22 6 - 23 mg/dL   Creatinine, Ser 1.14 (H) 0.50 - 1.10 mg/dL   Calcium 9.2 8.4 - 10.5 mg/dL   Total Protein 7.0 6.0 - 8.3 g/dL   Albumin 3.8 3.5 - 5.2 g/dL   AST 28 0 - 37 U/L   ALT 19 0 - 35 U/L   Alkaline Phosphatase 69 39 - 117 U/L   Total Bilirubin 1.4 (H) 0.3 - 1.2 mg/dL   GFR calc non Af Amer 47 (L) >90 mL/min   GFR calc Af Amer 55  (L) >90 mL/min   Anion gap 7 5 - 15  Lipase, blood     Status: None   Collection Time: 12/25/13  2:52 AM  Result Value Ref Range   Lipase 21 11 - 59 U/L    Imaging Studies: Ct Abdomen Pelvis W Contrast  12/25/2013   CLINICAL DATA:  Initial evaluation for  lower abdominal cramping. Leukocytosis.  EXAM: CT ABDOMEN AND PELVIS WITH CONTRAST  TECHNIQUE: Multidetector CT imaging of the abdomen and pelvis was performed using the standard protocol following bolus administration of intravenous contrast.  CONTRAST:  175mL OMNIPAQUE IOHEXOL 300 MG/ML  SOLN  COMPARISON:  Prior ultrasound from 09/09/2013.  FINDINGS: The visualized lung bases are clear. No pleural or pericardial effusion.  The liver demonstrates a normal contrast enhanced appearance. Gallbladder within normal limits. No biliary dilatation. The spleen, adrenal glands, and pancreas demonstrate a normal contrast enhanced appearance.  Multiple parapelvic cysts noted within the left kidney. Kidneys are otherwise unremarkable without evidence of nephrolithiasis or hydronephrosis. No focal enhancing renal mass.  Stomach within normal limits. No evidence for bowel obstruction. There is an inflamed diverticula in the sigmoid colon with associated inflammatory fat stranding and wall thickening, consistent with acute diverticulitis. Small amount of free fluid present within the pelvis, likely reactive. There is a small focus of gas within this region that is suspicious for lying and extraluminal location (series 2, image 60). Finding suggests of associated micro perforation. No diverticular abscess.  No other acute inflammatory changes seen about the bowels. Appendix is normal.  Bladder within normal limits.  Uterus and ovaries are normal.  No pathologically enlarged intra-abdominal pelvic lymph nodes.  No acute osseous abnormality. No worrisome lytic or blastic osseous lesions. Suspicious for associated micro perforation (series 2, image 60). No diverticular  abscess.  IMPRESSION: Findings consistent with acute sigmoid diverticulitis. There are a few small foci of gas in the region of inflammation better not definitely intraluminal in location, suspicious for possible associated micro perforation. No diverticular abscess identified.  Critical Value/emergent results were called by telephone at the time of interpretation on 12/25/2013 at 5:38 am to Dr. Shanon Rosser , who verbally acknowledged these results.   Electronically Signed   By: Jeannine Boga M.D.   On: 12/25/2013 05:48   5:53 AM Zosyn 4.5 grams IV ordered for diverticulitis.     Wynetta Fines, MD 12/25/13 [phone]

## 2013-12-25 NOTE — ED Notes (Signed)
Patient transported to CT 

## 2013-12-26 LAB — BASIC METABOLIC PANEL
Anion gap: 7 (ref 5–15)
BUN: 11 mg/dL (ref 6–23)
CALCIUM: 8.6 mg/dL (ref 8.4–10.5)
CO2: 25 mmol/L (ref 19–32)
CREATININE: 1.11 mg/dL — AB (ref 0.50–1.10)
Chloride: 107 mEq/L (ref 96–112)
GFR calc Af Amer: 57 mL/min — ABNORMAL LOW (ref >90)
GFR calc non Af Amer: 49 mL/min — ABNORMAL LOW (ref >90)
GLUCOSE: 100 mg/dL — AB (ref 70–99)
Potassium: 3.9 mmol/L (ref 3.5–5.1)
Sodium: 139 mmol/L (ref 135–145)

## 2013-12-26 LAB — CBC
HEMATOCRIT: 35.2 % — AB (ref 36.0–46.0)
Hemoglobin: 11.4 g/dL — ABNORMAL LOW (ref 12.0–15.0)
MCH: 28.9 pg (ref 26.0–34.0)
MCHC: 32.4 g/dL (ref 30.0–36.0)
MCV: 89.1 fL (ref 78.0–100.0)
Platelets: 165 10*3/uL (ref 150–400)
RBC: 3.95 MIL/uL (ref 3.87–5.11)
RDW: 13 % (ref 11.5–15.5)
WBC: 7.6 10*3/uL (ref 4.0–10.5)

## 2013-12-26 MED ORDER — CIPROFLOXACIN HCL 500 MG PO TABS: 500.0000 mg | ORAL_TABLET | Freq: Two times a day (BID) | ORAL | Status: DC

## 2013-12-26 MED ORDER — METRONIDAZOLE 500 MG PO TABS: 500.0000 mg | ORAL_TABLET | Freq: Three times a day (TID) | ORAL | Status: DC

## 2013-12-26 MED ORDER — HYDROCODONE-ACETAMINOPHEN 5-325 MG PO TABS: 1.0000 | ORAL_TABLET | Freq: Four times a day (QID) | ORAL | Status: DC | PRN

## 2013-12-26 NOTE — Discharge Summary (Addendum)
Physician Discharge Summary  Laurie Gomez MRN: [id number] DOB/AGE: 1940-07-28 73 y.o.  PCP: Mathews Argyle, MD   Admit date: 12/25/2013 Discharge date: 12/26/2013  Discharge Diagnoses:     Acute diverticulitis with microperforation Hypertension  Recommendations Follow-up with PCP in 3-4 days Repeat CBC, BMP in one week Patient recommended to have outpatient colonoscopy,    Medication List    STOP taking these medications        triamterene-hydrochlorothiazide 75-50 MG per tablet  Commonly known as:  MAXZIDE      TAKE these medications        anastrozole 1 MG tablet  Commonly known as:  ARIMIDEX  Take 1 tablet (1 mg total) by mouth daily.     Calcium Carbonate-Vitamin D 600-125 MG-UNIT Tabs  Take 1 tablet by mouth daily.     ciprofloxacin 500 MG tablet  Commonly known as:  CIPRO  Take 1 tablet (500 mg total) by mouth 2 (two) times daily.     HYDROcodone-acetaminophen 5-325 MG per tablet  Commonly known as:  NORCO/VICODIN  Take 1-2 tablets by mouth every 6 (six) hours as needed for moderate pain or severe pain.     LOTREL 2.5-10 MG per capsule  Generic drug:  amlodipine-benazepril  Take 1 capsule by mouth daily.     metroNIDAZOLE 500 MG tablet  Commonly known as:  FLAGYL  Take 1 tablet (500 mg total) by mouth 3 (three) times daily.     multivitamin tablet  Take 1 tablet by mouth daily.     SYNTHROID 75 MCG tablet  Generic drug:  levothyroxine  Take 75 mcg by mouth daily before breakfast.     vitamin B-12 50 MCG tablet  Commonly known as:  CYANOCOBALAMIN  Take 2 tablets by mouth daily.        Discharge Condition: Stable   Disposition: Home   Consults:  None  Significant Diagnostic Studies: Ct Abdomen Pelvis W Contrast  12/25/2013   CLINICAL DATA:  Initial evaluation for lower abdominal cramping. Leukocytosis.  EXAM: CT ABDOMEN AND PELVIS WITH CONTRAST  TECHNIQUE: Multidetector CT imaging of the abdomen and pelvis was  performed using the standard protocol following bolus administration of intravenous contrast.  CONTRAST:  162m OMNIPAQUE IOHEXOL 300 MG/ML  SOLN  COMPARISON:  Prior ultrasound from 09/09/2013.  FINDINGS: The visualized lung bases are clear. No pleural or pericardial effusion.  The liver demonstrates a normal contrast enhanced appearance. Gallbladder within normal limits. No biliary dilatation. The spleen, adrenal glands, and pancreas demonstrate a normal contrast enhanced appearance.  Multiple parapelvic cysts noted within the left kidney. Kidneys are otherwise unremarkable without evidence of nephrolithiasis or hydronephrosis. No focal enhancing renal mass.  Stomach within normal limits. No evidence for bowel obstruction. There is an inflamed diverticula in the sigmoid colon with associated inflammatory fat stranding and wall thickening, consistent with acute diverticulitis. Small amount of free fluid present within the pelvis, likely reactive. There is a small focus of gas within this region that is suspicious for lying and extraluminal location (series 2, image 60). Finding suggests of associated micro perforation. No diverticular abscess.  No other acute inflammatory changes seen about the bowels. Appendix is normal.  Bladder within normal limits.  Uterus and ovaries are normal.  No pathologically enlarged intra-abdominal pelvic lymph nodes.  No acute osseous abnormality. No worrisome lytic or blastic osseous lesions. Suspicious for associated micro perforation (series 2, image 60). No diverticular abscess.  IMPRESSION: Findings consistent with acute sigmoid diverticulitis. There are  a few small foci of gas in the region of inflammation better not definitely intraluminal in location, suspicious for possible associated micro perforation. No diverticular abscess identified.  Critical Value/emergent results were called by telephone at the time of interpretation on 12/25/2013 at 5:38 am to Dr. Shanon Rosser , who  verbally acknowledged these results.   Electronically Signed   By: Jeannine Boga M.D.   On: 12/25/2013 05:48      Microbiology: No results found for this or any previous visit (from the past 240 hour(s)).   Labs: Results for orders placed or performed during the hospital encounter of 12/25/13 (from the past 48 hour(s))  Urinalysis, Routine w reflex microscopic     Status: Abnormal   Collection Time: 12/25/13  2:27 AM  Result Value Ref Range   Color, Urine YELLOW YELLOW   APPearance CLEAR CLEAR   Specific Gravity, Urine 1.023 1.005 - 1.030   pH 6.0 5.0 - 8.0   Glucose, UA NEGATIVE NEGATIVE mg/dL   Hgb urine dipstick NEGATIVE NEGATIVE   Bilirubin Urine NEGATIVE NEGATIVE   Ketones, ur NEGATIVE NEGATIVE mg/dL   Protein, ur NEGATIVE NEGATIVE mg/dL   Urobilinogen, UA 0.2 0.0 - 1.0 mg/dL   Nitrite NEGATIVE NEGATIVE   Leukocytes, UA MODERATE (A) NEGATIVE  Urine microscopic-add on     Status: Abnormal   Collection Time: 12/25/13  2:27 AM  Result Value Ref Range   Squamous Epithelial / LPF RARE RARE   WBC, UA 3-6 <3 WBC/hpf   Bacteria, UA FEW (A) RARE  CBC with Differential     Status: Abnormal   Collection Time: 12/25/13  2:52 AM  Result Value Ref Range   WBC 11.7 (H) 4.0 - 10.5 K/uL   RBC 4.33 3.87 - 5.11 MIL/uL   Hemoglobin 12.9 12.0 - 15.0 g/dL   HCT 38.7 36.0 - 46.0 %   MCV 89.4 78.0 - 100.0 fL   MCH 29.8 26.0 - 34.0 pg   MCHC 33.3 30.0 - 36.0 g/dL   RDW 12.9 11.5 - 15.5 %   Platelets 208 150 - 400 K/uL   Neutrophils Relative % 73 43 - 77 %   Neutro Abs 8.6 (H) 1.7 - 7.7 K/uL   Lymphocytes Relative 18 12 - 46 %   Lymphs Abs 2.1 0.7 - 4.0 K/uL   Monocytes Relative 8 3 - 12 %   Monocytes Absolute 0.9 0.1 - 1.0 K/uL   Eosinophils Relative 1 0 - 5 %   Eosinophils Absolute 0.1 0.0 - 0.7 K/uL   Basophils Relative 0 0 - 1 %   Basophils Absolute 0.0 0.0 - 0.1 K/uL  Comprehensive metabolic panel     Status: Abnormal   Collection Time: 12/25/13  2:52 AM  Result Value  Ref Range   Sodium 139 135 - 145 mmol/L    Comment: Please note change in reference range.   Potassium 4.6 3.5 - 5.1 mmol/L    Comment: Please note change in reference range.   Chloride 107 96 - 112 mEq/L   CO2 25 19 - 32 mmol/L   Glucose, Bld 124 (H) 70 - 99 mg/dL   BUN 22 6 - 23 mg/dL   Creatinine, Ser 1.14 (H) 0.50 - 1.10 mg/dL   Calcium 9.2 8.4 - 10.5 mg/dL   Total Protein 7.0 6.0 - 8.3 g/dL   Albumin 3.8 3.5 - 5.2 g/dL   AST 28 0 - 37 U/L   ALT 19 0 - 35 U/L   Alkaline  Phosphatase 69 39 - 117 U/L   Total Bilirubin 1.4 (H) 0.3 - 1.2 mg/dL   GFR calc non Af Amer 47 (L) >90 mL/min   GFR calc Af Amer 55 (L) >90 mL/min    Comment: (NOTE) The eGFR has been calculated using the CKD EPI equation. This calculation has not been validated in all clinical situations. eGFR's persistently <90 mL/min signify possible Chronic Kidney Disease.    Anion gap 7 5 - 15  Lipase, blood     Status: None   Collection Time: 12/25/13  2:52 AM  Result Value Ref Range   Lipase 21 11 - 59 U/L  Basic metabolic panel     Status: Abnormal   Collection Time: 12/26/13  4:35 AM  Result Value Ref Range   Sodium 139 135 - 145 mmol/L    Comment: Please note change in reference range.   Potassium 3.9 3.5 - 5.1 mmol/L    Comment: Please note change in reference range.   Chloride 107 96 - 112 mEq/L   CO2 25 19 - 32 mmol/L   Glucose, Bld 100 (H) 70 - 99 mg/dL   BUN 11 6 - 23 mg/dL    Comment: REPEATED TO VERIFY DELTA CHECK NOTED    Creatinine, Ser 1.11 (H) 0.50 - 1.10 mg/dL   Calcium 8.6 8.4 - 10.5 mg/dL   GFR calc non Af Amer 49 (L) >90 mL/min   GFR calc Af Amer 57 (L) >90 mL/min    Comment: (NOTE) The eGFR has been calculated using the CKD EPI equation. This calculation has not been validated in all clinical situations. eGFR's persistently <90 mL/min signify possible Chronic Kidney Disease.    Anion gap 7 5 - 15  CBC     Status: Abnormal   Collection Time: 12/26/13  4:35 AM  Result Value Ref  Range   WBC 7.6 4.0 - 10.5 K/uL   RBC 3.95 3.87 - 5.11 MIL/uL   Hemoglobin 11.4 (L) 12.0 - 15.0 g/dL   HCT 35.2 (L) 36.0 - 46.0 %   MCV 89.1 78.0 - 100.0 fL   MCH 28.9 26.0 - 34.0 pg   MCHC 32.4 30.0 - 36.0 g/dL   RDW 13.0 11.5 - 15.5 %   Platelets 165 150 - 400 K/uL     HPI :Laurie Gomez is a 73 y.o. female who presents to the ED with a h/o abdominal cramping. Pain is located in suprapubic and LLQ areas. It is associated with NB diarrhea. Mild nausea but no vomiting. Has chills but no fever she states. Quality is "like severe menstural cramps". CT scan of the abdomen and pelvis showed acute sigmoid diverticulitis with microperforation. Patient admitted and started on IV antibiotics   HOSPITAL COURSE:  Acute diverticulitis with microperforation Patient started on IV Zosyn, discussed with Dr. Lucia Gaskins on the phone Patient appears nontoxic Okay to switch to ciprofloxacin and Flagyl for 2 weeks Close follow-up with PCP, if pain persists, patient will need outpatient general surgery consultation Patient would probably also needs her balance colonoscopy Will advance diet today, if tolerates patient will go home   Hypertension Discontinue Maxzide Okay to continue with other antihypertensives   Discharge Exam: Blood pressure 148/72, pulse 88, temperature 98.4 F (36.9 C), temperature source Oral, resp. rate 16, height 5' 5"  (1.651 m), weight 83.144 kg (183 lb 4.8 oz), SpO2 96 %.  General Appearance:   Alert, oriented, no distress, appears stated age  Head:   Normocephalic, atraumatic  Eyes:  PERRL, EOMI, sclera non-icteric      Nose:  Nares without drainage or epistaxis. Mucosa, turbinates normal  Throat:  Moist mucous membranes. Oropharynx without erythema or exudate.  Neck:  Supple. No carotid bruits. No thyromegaly. No lymphadenopathy.   Back:   No CVA tenderness, no spinal tenderness  Lungs:   Clear to auscultation bilaterally, without  wheezes, rhonchi or rales  Chest wall:   No tenderness to palpitation  Heart:   Regular rate and rhythm without murmurs, gallops, rubs  Abdomen:   Soft, non-tender, nondistended, normal bowel sounds, no organomegaly          Signed: Vera Furniss 12/26/2013, 12:04 PM

## 2013-12-29 DIAGNOSIS — I10 Essential (primary) hypertension: Secondary | ICD-10-CM | POA: Diagnosis not present

## 2013-12-29 DIAGNOSIS — K5792 Diverticulitis of intestine, part unspecified, without perforation or abscess without bleeding: Secondary | ICD-10-CM | POA: Diagnosis not present

## 2014-01-05 ENCOUNTER — Encounter: Payer: Self-pay | Admitting: Hematology and Oncology

## 2014-01-08 NOTE — Telephone Encounter (Signed)
Called patient to discuss rash. She reports it has started to fade away now and has not come up anywhere else. Rash did not itch either. Doubtful that the antibiotics caused radiation recall. Encouraged her to call radiation oncology if this happens to her breast again where she was treated.

## 2014-02-03 ENCOUNTER — Other Ambulatory Visit (HOSPITAL_BASED_OUTPATIENT_CLINIC_OR_DEPARTMENT_OTHER): Payer: Medicare Other

## 2014-02-03 ENCOUNTER — Encounter: Payer: Self-pay | Admitting: Hematology and Oncology

## 2014-02-03 ENCOUNTER — Telehealth: Payer: Self-pay | Admitting: Hematology and Oncology

## 2014-02-03 ENCOUNTER — Ambulatory Visit (HOSPITAL_BASED_OUTPATIENT_CLINIC_OR_DEPARTMENT_OTHER): Payer: Medicare Other | Admitting: Hematology and Oncology

## 2014-02-03 DIAGNOSIS — C50912 Malignant neoplasm of unspecified site of left female breast: Secondary | ICD-10-CM

## 2014-02-03 DIAGNOSIS — D0512 Intraductal carcinoma in situ of left breast: Secondary | ICD-10-CM | POA: Diagnosis not present

## 2014-02-03 LAB — COMPREHENSIVE METABOLIC PANEL (CC13)
ALK PHOS: 78 U/L (ref 40–150)
ALT: 36 U/L (ref 0–55)
AST: 25 U/L (ref 5–34)
Albumin: 3.9 g/dL (ref 3.5–5.0)
Anion Gap: 10 mEq/L (ref 3–11)
BILIRUBIN TOTAL: 0.45 mg/dL (ref 0.20–1.20)
BUN: 20.2 mg/dL (ref 7.0–26.0)
CO2: 27 mEq/L (ref 22–29)
Calcium: 9.4 mg/dL (ref 8.4–10.4)
Chloride: 102 mEq/L (ref 98–109)
Creatinine: 1 mg/dL (ref 0.6–1.1)
EGFR: 58 mL/min/{1.73_m2} — AB (ref >90)
Glucose: 86 mg/dl (ref 70–140)
Potassium: 3.9 mEq/L (ref 3.5–5.1)
SODIUM: 139 meq/L (ref 136–145)
TOTAL PROTEIN: 7.1 g/dL (ref 6.4–8.3)

## 2014-02-03 LAB — CBC WITH DIFFERENTIAL/PLATELET
BASO%: 0.8 % (ref 0.0–2.0)
BASOS ABS: 0.1 10*3/uL (ref 0.0–0.1)
EOS%: 1.5 % (ref 0.0–7.0)
Eosinophils Absolute: 0.1 10*3/uL (ref 0.0–0.5)
HCT: 43.5 % (ref 34.8–46.6)
HGB: 14 g/dL (ref 11.6–15.9)
LYMPH#: 2.4 10*3/uL (ref 0.9–3.3)
LYMPH%: 33.4 % (ref 14.0–49.7)
MCH: 28.8 pg (ref 25.1–34.0)
MCHC: 32.2 g/dL (ref 31.5–36.0)
MCV: 89.4 fL (ref 79.5–101.0)
MONO#: 0.8 10*3/uL (ref 0.1–0.9)
MONO%: 10.9 % (ref 0.0–14.0)
NEUT%: 53.4 % (ref 38.4–76.8)
NEUTROS ABS: 3.8 10*3/uL (ref 1.5–6.5)
Platelets: 198 10*3/uL (ref 145–400)
RBC: 4.87 10*6/uL (ref 3.70–5.45)
RDW: 13.5 % (ref 11.2–14.5)
WBC: 7.1 10*3/uL (ref 3.9–10.3)

## 2014-02-03 NOTE — Assessment & Plan Note (Signed)
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  Elevated ALT and AST and alkaline phosphatase: Ultrasound of the liver showed fatty liver. These abnormal blood test had normalized after stopping Aromasin and switching her to Arimidex. Today her liver function tests are normal.  Breast cancer surveillance: 1. Mammograms to be done in March 2016 2. Return to clinic in 6 months for physical exam of the breasts and follow-up  Anastrozole toxicities: 1. Muscle aches and pains Patient was admitted for diverticulitis Christmas 2015 and took antibiotics for a couple of weeks.  Return to clinic in 6 months for follow-up.

## 2014-02-03 NOTE — Telephone Encounter (Signed)
per pof to sch pt appt-gave pt copy of sch °

## 2014-02-03 NOTE — Progress Notes (Signed)
Patient Care Team: Lajean Manes, MD as PCP - General (Internal Medicine)  DIAGNOSIS: Neoplasm of left breast, primary tumor staging category Tis: ductal carcinoma in situ (DCIS)   Staging form: Breast, AJCC 7th Edition     Clinical: Tis (DCIS) - Unsigned     Pathologic: TisN0 (Tis (DCIS)) - Signed by Rulon Eisenmenger, MD on 08/28/2013   SUMMARY OF ONCOLOGIC HISTORY:   Neoplasm of left breast, primary tumor staging category Tis: ductal carcinoma in situ (DCIS)   06/24/2012 Surgery Left breast lumpectomy and sentinel lymph node biopsy DCIS   08/30/2012 - 09/28/2012 Radiation Therapy Radiation to lumpectomy site   10/02/2012 - 10/02/2013 Anti-estrogen oral therapy Tamoxifen 20 mg daily started 10/02/2012 discontinued due to superficial leg vein thrombosis. Aromasin 25 mg started July 2015 stopped for muscle aches, restarted 08/28/2013 stopped in 10/02/13 due to elevation AST adn ALT   10/02/2013 -  Anti-estrogen oral therapy Arimidex 1 mg daily    CHIEF COMPLIANT: Follow-up on anastrozole  INTERVAL HISTORY: Laurie Gomez is a 74 year old lady with above-mentioned history of DCIS is current on antiestrogen therapy with anastrozole at 1 mg daily. She is tolerating anastrozole fairly well. She was admitted to the hospital in December 2015 for diverticulitis. She took antibiotics for 2 weeks. She denies any other problems from antiestrogen therapy other than muscle aches and pains.  REVIEW OF SYSTEMS:   Constitutional: Denies fevers, chills or abnormal weight loss Eyes: Denies blurriness of vision Ears, nose, mouth, throat, and face: Denies mucositis or sore throat Respiratory: Denies cough, dyspnea or wheezes Cardiovascular: Denies palpitation, chest discomfort or lower extremity swelling Gastrointestinal:  Denies nausea, heartburn or change in bowel habits Skin: Denies abnormal skin rashes Lymphatics: Denies new lymphadenopathy or easy bruising Neurological:Denies numbness, tingling or new  weaknesses Behavioral/Psych: Mood is stable, no new changes  Breast:  denies any pain or lumps or nodules in either breasts All other systems were reviewed with the patient and are negative.  I have reviewed the past medical history, past surgical history, social history and family history with the patient and they are unchanged from previous note.  ALLERGIES:  has No Known Allergies.  MEDICATIONS:  Current Outpatient Prescriptions  Medication Sig Dispense Refill  . amlodipine-benazepril (LOTREL) 2.5-10 MG per capsule   3  . anastrozole (ARIMIDEX) 1 MG tablet Take 1 tablet (1 mg total) by mouth daily. 90 tablet 6  . Calcium Carbonate-Vitamin D 600-125 MG-UNIT TABS Take 1 tablet by mouth daily.     Marland Kitchen levothyroxine (SYNTHROID) 75 MCG tablet Take 75 mcg by mouth daily before breakfast.     . Multiple Vitamin (MULTIVITAMIN) tablet Take 1 tablet by mouth daily.     . Psyllium (WAL-MUCIL PO) Take 2 tablets by mouth daily.    . vitamin B-12 (CYANOCOBALAMIN) 50 MCG tablet Take 2 tablets by mouth daily.     Marland Kitchen amlodipine-benazepril (LOTREL) 2.5-10 MG per capsule Take 1 capsule by mouth daily.      No current facility-administered medications for this visit.    PHYSICAL EXAMINATION: ECOG PERFORMANCE STATUS: 0 - Asymptomatic  Filed Vitals:   02/03/14 0914  BP: 145/53  Pulse: 64  Temp: 98.6 F (37 C)  Resp: 18   Filed Weights   02/03/14 0914  Weight: 179 lb 9.6 oz (81.466 kg)    GENERAL:alert, no distress and comfortable SKIN: skin color, texture, turgor are normal, no rashes or significant lesions EYES: normal, Conjunctiva are pink and non-injected, sclera clear OROPHARYNX:no exudate, no erythema and  lips, buccal mucosa, and tongue normal  NECK: supple, thyroid normal size, non-tender, without nodularity LYMPH:  no palpable lymphadenopathy in the cervical, axillary or inguinal LUNGS: clear to auscultation and percussion with normal breathing effort HEART: regular rate & rhythm and  no murmurs and no lower extremity edema ABDOMEN:abdomen soft, non-tender and normal bowel sounds Musculoskeletal:no cyanosis of digits and no clubbing  NEURO: alert & oriented x 3 with fluent speech, no focal motor/sensory deficits  LABORATORY DATA:  I have reviewed the data as listed   Chemistry      Component Value Date/Time   NA 139 02/03/2014 0853   NA 139 12/26/2013 0435   K 3.9 02/03/2014 0853   K 3.9 12/26/2013 0435   CL 107 12/26/2013 0435   CO2 27 02/03/2014 0853   CO2 25 12/26/2013 0435   BUN 20.2 02/03/2014 0853   BUN 11 12/26/2013 0435   CREATININE 1.0 02/03/2014 0853   CREATININE 1.11* 12/26/2013 0435      Component Value Date/Time   CALCIUM 9.4 02/03/2014 0853   CALCIUM 8.6 12/26/2013 0435   ALKPHOS 78 02/03/2014 0853   ALKPHOS 69 12/25/2013 0252   AST 25 02/03/2014 0853   AST 28 12/25/2013 0252   ALT 36 02/03/2014 0853   ALT 19 12/25/2013 0252   BILITOT 0.45 02/03/2014 0853   BILITOT 1.4* 12/25/2013 0252       Lab Results  Component Value Date   WBC 7.1 02/03/2014   HGB 14.0 02/03/2014   HCT 43.5 02/03/2014   MCV 89.4 02/03/2014   PLT 198 02/03/2014   NEUTROABS 3.8 02/03/2014   ASSESSMENT & PLAN:  Neoplasm of left breast, primary tumor staging category Tis: ductal carcinoma in situ (DCIS) DCIS left breast status post lumpectomy and radiation, could not tolerate tamoxifen because of superficial venous thrombosis currently on Arimidex.  Elevated ALT and AST and alkaline phosphatase: Ultrasound of the liver showed fatty liver. These abnormal blood test had normalized after stopping Aromasin and switching her to Arimidex. Today her liver function tests are normal.  Breast cancer surveillance: 1. Mammograms to be done in March 2016 2. Return to clinic in 6 months for physical exam of the breasts and follow-up  Anastrozole toxicities: 1. Muscle aches and pains Patient was admitted for diverticulitis Christmas 2015 and took antibiotics for a couple of  weeks.  Return to clinic in 6 months for follow-up.     Orders Placed This Encounter  Procedures  . CBC with Differential    Standing Status: Future     Number of Occurrences:      Standing Expiration Date: 02/03/2015  . Comprehensive metabolic panel (Cmet) - [facility]    Standing Status: Future     Number of Occurrences:      Standing Expiration Date: 02/03/2015   The patient has a good understanding of the overall plan. she agrees with it. She will call with any problems that may develop before her next visit here.   Rulon Eisenmenger, MD

## 2014-02-04 NOTE — Addendum Note (Signed)
Addended by: Renford Dills on: 02/04/2014 10:31 AM   Modules accepted: Medications

## 2014-02-23 ENCOUNTER — Telehealth: Payer: Self-pay | Admitting: *Deleted

## 2014-02-23 DIAGNOSIS — D0512 Intraductal carcinoma in situ of left breast: Secondary | ICD-10-CM

## 2014-02-23 NOTE — Telephone Encounter (Signed)
Let pt know she would need a doppler.  Patient prefers to wait so she can go to [facility].  Appt made for 9 am 2/23 at [facility].  Pt voiced understanding.

## 2014-02-23 NOTE — Telephone Encounter (Signed)
Message from patient reporting "I'm on an estrogen blocker.  I have a stiff knee with a lot of pain.  Do I call my other attending doctor or Dr. Lindi Adie?"  Called and now reports "right knee is sore.  Started 2-3-days ago.  This is the same leg I had the blood clot in with tamoxifen use but this not the same."  It is not swollen, no redness, not warm and it's just the knee.  I do have stiff joints to hip and shoulder.  Saturday I did a lot of walking, cooking and shopping.  When I prop it up it gets better.  When I get up I'm stiff and sensitive walking."  Return call number [phone].

## 2014-02-24 ENCOUNTER — Telehealth: Payer: Self-pay

## 2014-02-24 ENCOUNTER — Ambulatory Visit (HOSPITAL_COMMUNITY)
Admission: RE | Admit: 2014-02-24 | Discharge: 2014-02-24 | Disposition: A | Discharge status: Home / Self Care | Payer: Medicare Other | Source: Ambulatory Visit | Attending: Hematology and Oncology | Admitting: Hematology and Oncology

## 2014-02-24 DIAGNOSIS — D0512 Intraductal carcinoma in situ of left breast: Secondary | ICD-10-CM

## 2014-02-24 DIAGNOSIS — Z86718 Personal history of other venous thrombosis and embolism: Secondary | ICD-10-CM | POA: Insufficient documentation

## 2014-02-24 DIAGNOSIS — M25561 Pain in right knee: Secondary | ICD-10-CM | POA: Diagnosis not present

## 2014-02-24 DIAGNOSIS — M79609 Pain in unspecified limb: Secondary | ICD-10-CM

## 2014-02-24 DIAGNOSIS — M25569 Pain in unspecified knee: Secondary | ICD-10-CM | POA: Diagnosis not present

## 2014-02-24 NOTE — Progress Notes (Signed)
Right lower extremity venous duplex completed.  Right:  No evidence of DVT, superficial thrombosis, or Baker's cyst.  Left:  Negative for DVT in the common femoral vein.  

## 2014-02-24 NOTE — Telephone Encounter (Signed)
Confirmed pt knows results of doppler were negative.  Let pt know she should see PCP for further evaluation.  Pt voiced understanding.

## 2014-03-06 DIAGNOSIS — R202 Paresthesia of skin: Secondary | ICD-10-CM | POA: Diagnosis not present

## 2014-03-06 DIAGNOSIS — I129 Hypertensive chronic kidney disease with stage 1 through stage 4 chronic kidney disease, or unspecified chronic kidney disease: Secondary | ICD-10-CM | POA: Diagnosis not present

## 2014-03-06 DIAGNOSIS — N183 Chronic kidney disease, stage 3 (moderate): Secondary | ICD-10-CM | POA: Diagnosis not present

## 2014-03-06 DIAGNOSIS — Z0000 Encounter for general adult medical examination without abnormal findings: Secondary | ICD-10-CM | POA: Diagnosis not present

## 2014-03-06 DIAGNOSIS — E039 Hypothyroidism, unspecified: Secondary | ICD-10-CM | POA: Diagnosis not present

## 2014-03-06 DIAGNOSIS — Z1389 Encounter for screening for other disorder: Secondary | ICD-10-CM | POA: Diagnosis not present

## 2014-03-06 DIAGNOSIS — M25561 Pain in right knee: Secondary | ICD-10-CM | POA: Diagnosis not present

## 2014-03-07 ENCOUNTER — Emergency Department (HOSPITAL_COMMUNITY)
Admission: EM | Admit: 2014-03-07 | Discharge: 2014-03-07 | Disposition: A | Discharge status: Home / Self Care | Payer: Medicare Other | Attending: Emergency Medicine | Admitting: Emergency Medicine

## 2014-03-07 ENCOUNTER — Emergency Department (HOSPITAL_COMMUNITY): Payer: Medicare Other

## 2014-03-07 ENCOUNTER — Encounter (HOSPITAL_COMMUNITY): Payer: Self-pay | Admitting: Emergency Medicine

## 2014-03-07 DIAGNOSIS — Z86010 Personal history of colonic polyps: Secondary | ICD-10-CM | POA: Insufficient documentation

## 2014-03-07 DIAGNOSIS — I10 Essential (primary) hypertension: Secondary | ICD-10-CM | POA: Diagnosis not present

## 2014-03-07 DIAGNOSIS — Z79899 Other long term (current) drug therapy: Secondary | ICD-10-CM | POA: Diagnosis not present

## 2014-03-07 DIAGNOSIS — Z8719 Personal history of other diseases of the digestive system: Secondary | ICD-10-CM | POA: Diagnosis not present

## 2014-03-07 DIAGNOSIS — M25461 Effusion, right knee: Secondary | ICD-10-CM | POA: Insufficient documentation

## 2014-03-07 DIAGNOSIS — M810 Age-related osteoporosis without current pathological fracture: Secondary | ICD-10-CM | POA: Diagnosis not present

## 2014-03-07 DIAGNOSIS — M25561 Pain in right knee: Secondary | ICD-10-CM

## 2014-03-07 DIAGNOSIS — Z853 Personal history of malignant neoplasm of breast: Secondary | ICD-10-CM | POA: Diagnosis not present

## 2014-03-07 DIAGNOSIS — Z862 Personal history of diseases of the blood and blood-forming organs and certain disorders involving the immune mechanism: Secondary | ICD-10-CM | POA: Insufficient documentation

## 2014-03-07 MED ORDER — OXYCODONE-ACETAMINOPHEN 5-325 MG PO TABS: 1.0000 | ORAL_TABLET | ORAL | Status: DC | PRN

## 2014-03-07 MED ORDER — OXYCODONE-ACETAMINOPHEN 5-325 MG PO TABS
2.0000 | ORAL_TABLET | Freq: Once | ORAL | Status: AC
  Administered 2014-03-07: 2 via ORAL
  Filled 2014-03-07: qty 2

## 2014-03-07 NOTE — Discharge Instructions (Signed)

## 2014-03-07 NOTE — ED Notes (Addendum)
MD Kohut at bedside, Ice pack provided.

## 2014-03-07 NOTE — ED Provider Notes (Signed)
CSN: [id number]     Arrival date & time 03/07/14  0757 History   First MD Initiated Contact with Patient 03/07/14 0802     Chief Complaint  Patient presents with  . Knee Pain     (Consider location/radiation/quality/duration/timing/severity/associated sxs/prior Treatment) HPI   73yf with R knee pain. Has been bothering for weeks. Saw PCP and taking tramadol. Referred to [facility] and has upcoming appointment next week. Yesterday was out shopping and had acute onset of more severe pain as she was coming down some steps. Pain in posterior and medial aspect of knee now when bearing weight. Feels ok at rest. Pain worst when bearing weight with leg fully extended. Feels better ambulating on toes with knee slightly flexed. Some swelling. No numbness or tingling.  No other acute complaints.   Past Medical History  Diagnosis Date  . Breast cancer   . Osteoporosis   . superifical thrombosis   . Colon polyps   . Hemorrhoids   . Edema   . Hypertension    Past Surgical History  Procedure Laterality Date  . Thyroidectomy, partial  2000  . Breast lumpectomy  2014  . Tubal ligation  1975  . Dilatation & curettage/hysteroscopy with trueclear  1975   Family History  Problem Relation Age of Onset  . Cancer Sister 61    Breast cancer  . Cancer Cousin 64    Breast cancer   History  Substance Use Topics  . Smoking status: Never Smoker   . Smokeless tobacco: Never Used  . Alcohol Use: Yes   OB History    No data available     Review of Systems  All systems reviewed and negative, other than as noted in HPI.   Allergies  Review of patient's allergies indicates no known allergies.  Home Medications   Prior to Admission medications   Medication Sig Start Date End Date Taking? Authorizing Provider  amlodipine-benazepril (LOTREL) 2.5-10 MG per capsule Take 1 capsule by mouth daily.  11/06/12 12/25/13  Historical Provider, MD  amlodipine-benazepril (LOTREL) 2.5-10 MG per  capsule  01/28/14   Historical Provider, MD  anastrozole (ARIMIDEX) 1 MG tablet Take 1 tablet (1 mg total) by mouth daily. 11/03/13   Rulon Eisenmenger, MD  Calcium Carbonate-Vitamin D 600-125 MG-UNIT TABS Take 1 tablet by mouth daily.     Historical Provider, MD  levothyroxine (SYNTHROID) 75 MCG tablet Take 75 mcg by mouth daily before breakfast.  01/21/13   Historical Provider, MD  Multiple Vitamin (MULTIVITAMIN) tablet Take 1 tablet by mouth daily.     Historical Provider, MD  Psyllium (WAL-MUCIL PO) Take 2 tablets by mouth daily.    Historical Provider, MD  triamterene-hydrochlorothiazide (MAXZIDE) 75-50 MG per tablet Take 1 tablet by mouth daily. Take 1/2 tablet daily.    Historical Provider, MD  vitamin B-12 (CYANOCOBALAMIN) 50 MCG tablet Take 2 tablets by mouth daily.     Historical Provider, MD   BP 158/79 mmHg  Pulse 78  Temp(Src) 97.9 F (36.6 C) (Oral)  Resp 16  SpO2 100% Physical Exam  Constitutional: She appears well-developed and well-nourished. No distress.  HENT:  Head: Normocephalic and atraumatic.  Eyes: Conjunctivae are normal. Right eye exhibits no discharge. Left eye exhibits no discharge.  Neck: Neck supple.  Cardiovascular: Normal rate, regular rhythm and normal heart sounds.  Exam reveals no gallop and no friction rub.   No murmur heard. Pulmonary/Chest: Effort normal and breath sounds normal. No respiratory distress.  Abdominal: Soft. She  exhibits no distension. There is no tenderness.  Musculoskeletal: She exhibits no edema or tenderness.  Swelling R knee. TTP medial/proximal tibia. Able to actively range with little apparent discomfort. NVI distally.   Neurological: She is alert.  Skin: Skin is warm and dry.  Psychiatric: She has a normal mood and affect. Her behavior is normal. Thought content normal.  Nursing note and vitals reviewed.   ED Course  Procedures (including critical care time) Labs Review Labs Reviewed - No data to display  Imaging Review Dg  Knee Complete 4 Views Right  03/07/2014   CLINICAL DATA:  Posterior RIGHT knee pain for 2 days. Initial encounter.  EXAM: RIGHT KNEE - COMPLETE 4+ VIEW  COMPARISON:  None.  FINDINGS: There is no evidence of fracture, dislocation, or joint effusion. There is no evidence of arthropathy or other focal bone abnormality. Soft tissues are unremarkable.  IMPRESSION: Negative.   Electronically Signed   By: Dereck Ligas M.D.   On: 03/07/2014 09:01     EKG Interpretation None      MDM   Final diagnoses:  Right knee pain   73yF with R knee pain. Some mild soft tissue swelling. No ligamentous laxity appreciated. Imaging neg. NVI. Has upcoming up orthopedic appointment. Previously prescribed tramadol.     Virgel Manifold, MD 03/07/14 [phone]

## 2014-03-07 NOTE — ED Notes (Signed)
Pt reports R knee pain for past 2-3 weeks. Has seen PCP, who thought it was arthritis. However, pt was shopping yesterday and twisted knee while going down some steps. Pain on medial side of knee. Slight swelling. Pain with weight bearing.

## 2014-03-07 NOTE — ED Notes (Signed)
MD at bedside. 

## 2014-03-09 DIAGNOSIS — S76311A Strain of muscle, fascia and tendon of the posterior muscle group at thigh level, right thigh, initial encounter: Secondary | ICD-10-CM | POA: Diagnosis not present

## 2014-03-10 DIAGNOSIS — M25561 Pain in right knee: Secondary | ICD-10-CM | POA: Diagnosis not present

## 2014-03-10 DIAGNOSIS — S76311D Strain of muscle, fascia and tendon of the posterior muscle group at thigh level, right thigh, subsequent encounter: Secondary | ICD-10-CM | POA: Diagnosis not present

## 2014-03-12 DIAGNOSIS — M25561 Pain in right knee: Secondary | ICD-10-CM | POA: Diagnosis not present

## 2014-03-12 DIAGNOSIS — S76311D Strain of muscle, fascia and tendon of the posterior muscle group at thigh level, right thigh, subsequent encounter: Secondary | ICD-10-CM | POA: Diagnosis not present

## 2014-03-17 DIAGNOSIS — M25561 Pain in right knee: Secondary | ICD-10-CM | POA: Diagnosis not present

## 2014-03-17 DIAGNOSIS — S76311D Strain of muscle, fascia and tendon of the posterior muscle group at thigh level, right thigh, subsequent encounter: Secondary | ICD-10-CM | POA: Diagnosis not present

## 2014-03-19 DIAGNOSIS — S76311D Strain of muscle, fascia and tendon of the posterior muscle group at thigh level, right thigh, subsequent encounter: Secondary | ICD-10-CM | POA: Diagnosis not present

## 2014-03-19 DIAGNOSIS — M25561 Pain in right knee: Secondary | ICD-10-CM | POA: Diagnosis not present

## 2014-03-25 DIAGNOSIS — S76311D Strain of muscle, fascia and tendon of the posterior muscle group at thigh level, right thigh, subsequent encounter: Secondary | ICD-10-CM | POA: Diagnosis not present

## 2014-03-25 DIAGNOSIS — M25561 Pain in right knee: Secondary | ICD-10-CM | POA: Diagnosis not present

## 2014-03-26 DIAGNOSIS — M25561 Pain in right knee: Secondary | ICD-10-CM | POA: Diagnosis not present

## 2014-03-26 DIAGNOSIS — S76311D Strain of muscle, fascia and tendon of the posterior muscle group at thigh level, right thigh, subsequent encounter: Secondary | ICD-10-CM | POA: Diagnosis not present

## 2014-03-27 ENCOUNTER — Telehealth: Payer: Self-pay | Admitting: *Deleted

## 2014-03-27 ENCOUNTER — Telehealth: Payer: Self-pay

## 2014-03-27 NOTE — Telephone Encounter (Signed)
Kia called to inform Beverlee Nims RN that she needs to fax a signed  Medical Records Release form from the patient to release the disc.  Fax release to [phone] ATT: Kia.   Kia is leaving now for the day so this will be handled on Monday after the release is received.

## 2014-03-27 NOTE — Telephone Encounter (Signed)
Called patient to let her know that she would need to reschedule her mammo appt for Monday as we do not have the disk with her prior imaging on it. Informed patient that I have requested another disk and they would mail [place] another copy on Monday. Patient verbalized understanding.

## 2014-03-27 NOTE — Telephone Encounter (Signed)
Faxed request to [facility] to send [place] disk with mammo images.

## 2014-03-30 NOTE — Telephone Encounter (Signed)
Faxed signed medical records release to Kia with [facility] to obtain CD with breast images.

## 2014-03-31 DIAGNOSIS — S76311D Strain of muscle, fascia and tendon of the posterior muscle group at thigh level, right thigh, subsequent encounter: Secondary | ICD-10-CM | POA: Diagnosis not present

## 2014-03-31 DIAGNOSIS — M25561 Pain in right knee: Secondary | ICD-10-CM | POA: Diagnosis not present

## 2014-04-02 DIAGNOSIS — M25561 Pain in right knee: Secondary | ICD-10-CM | POA: Diagnosis not present

## 2014-04-02 DIAGNOSIS — S76311D Strain of muscle, fascia and tendon of the posterior muscle group at thigh level, right thigh, subsequent encounter: Secondary | ICD-10-CM | POA: Diagnosis not present

## 2014-04-06 ENCOUNTER — Telehealth: Payer: Self-pay | Admitting: *Deleted

## 2014-04-06 ENCOUNTER — Ambulatory Visit: Payer: Medicare Other | Admitting: Hematology and Oncology

## 2014-04-06 ENCOUNTER — Other Ambulatory Visit: Payer: Medicare Other

## 2014-04-06 NOTE — Telephone Encounter (Addendum)
RADIOLOGY CD WAS TO COME TO THE [facility] BUT HAS NOT ARRIVED. CALLED [facility] TO SPEAK TO KIA. SHE HAS DONE FOR THE DAY. WILL CALL KIA TOMORROW AT [phone]. NOTIFIED PT. OF THE ABOVE INFORMATION.

## 2014-04-07 NOTE — Telephone Encounter (Signed)
NO NOTE

## 2014-04-07 NOTE — Telephone Encounter (Signed)
SPOKE TO KIA. CD WAS SENT OUT ON 04/01/14. IT TAKES FIVE TO TEN BUSINESS DAYS FOR CD TO ARRIVE. NOTIFIED KIM HEGERTY (HIM) TO CALL PT. WHEN CD ARRIVES. NOTIFIED PT. OF THE ABOVE INFORMATION. SHE VOICES UNDERSTANDING.

## 2014-04-08 DIAGNOSIS — S76311D Strain of muscle, fascia and tendon of the posterior muscle group at thigh level, right thigh, subsequent encounter: Secondary | ICD-10-CM | POA: Diagnosis not present

## 2014-04-09 DIAGNOSIS — S76311D Strain of muscle, fascia and tendon of the posterior muscle group at thigh level, right thigh, subsequent encounter: Secondary | ICD-10-CM | POA: Diagnosis not present

## 2014-04-09 DIAGNOSIS — M25561 Pain in right knee: Secondary | ICD-10-CM | POA: Diagnosis not present

## 2014-04-13 DIAGNOSIS — K5792 Diverticulitis of intestine, part unspecified, without perforation or abscess without bleeding: Secondary | ICD-10-CM | POA: Diagnosis not present

## 2014-04-15 ENCOUNTER — Ambulatory Visit
Admission: RE | Admit: 2014-04-15 | Discharge: 2014-04-15 | Disposition: A | Discharge status: Home / Self Care | Payer: Medicare Other | Source: Ambulatory Visit | Attending: Hematology and Oncology | Admitting: Hematology and Oncology

## 2014-04-15 DIAGNOSIS — C50919 Malignant neoplasm of unspecified site of unspecified female breast: Secondary | ICD-10-CM

## 2014-04-15 DIAGNOSIS — C50912 Malignant neoplasm of unspecified site of left female breast: Secondary | ICD-10-CM

## 2014-04-15 DIAGNOSIS — Z853 Personal history of malignant neoplasm of breast: Secondary | ICD-10-CM | POA: Diagnosis not present

## 2014-06-08 DIAGNOSIS — M25561 Pain in right knee: Secondary | ICD-10-CM | POA: Diagnosis not present

## 2014-06-24 DIAGNOSIS — S76311D Strain of muscle, fascia and tendon of the posterior muscle group at thigh level, right thigh, subsequent encounter: Secondary | ICD-10-CM | POA: Diagnosis not present

## 2014-06-24 DIAGNOSIS — M25561 Pain in right knee: Secondary | ICD-10-CM | POA: Diagnosis not present

## 2014-07-07 ENCOUNTER — Telehealth: Payer: Self-pay | Admitting: Hematology and Oncology

## 2014-07-07 ENCOUNTER — Ambulatory Visit (HOSPITAL_BASED_OUTPATIENT_CLINIC_OR_DEPARTMENT_OTHER): Payer: Medicare Other | Admitting: Hematology and Oncology

## 2014-07-07 ENCOUNTER — Encounter: Payer: Self-pay | Admitting: Hematology and Oncology

## 2014-07-07 ENCOUNTER — Other Ambulatory Visit (HOSPITAL_BASED_OUTPATIENT_CLINIC_OR_DEPARTMENT_OTHER): Payer: Medicare Other

## 2014-07-07 VITALS — BP 135/68 | HR 72 | Temp 98.1°F | Resp 18 | Ht 65.0 in | Wt 181.1 lb

## 2014-07-07 DIAGNOSIS — D0512 Intraductal carcinoma in situ of left breast: Secondary | ICD-10-CM | POA: Diagnosis not present

## 2014-07-07 LAB — CBC WITH DIFFERENTIAL/PLATELET
BASO%: 1 % (ref 0.0–2.0)
Basophils Absolute: 0.1 10*3/uL (ref 0.0–0.1)
EOS ABS: 0.2 10*3/uL (ref 0.0–0.5)
EOS%: 2.4 % (ref 0.0–7.0)
HCT: 39.6 % (ref 34.8–46.6)
HGB: 13.3 g/dL (ref 11.6–15.9)
LYMPH#: 2.3 10*3/uL (ref 0.9–3.3)
LYMPH%: 37.5 % (ref 14.0–49.7)
MCH: 29.8 pg (ref 25.1–34.0)
MCHC: 33.6 g/dL (ref 31.5–36.0)
MCV: 88.5 fL (ref 79.5–101.0)
MONO#: 0.8 10*3/uL (ref 0.1–0.9)
MONO%: 13.3 % (ref 0.0–14.0)
NEUT#: 2.8 10*3/uL (ref 1.5–6.5)
NEUT%: 45.8 % (ref 38.4–76.8)
Platelets: 183 10*3/uL (ref 145–400)
RBC: 4.47 10*6/uL (ref 3.70–5.45)
RDW: 13.3 % (ref 11.2–14.5)
WBC: 6.2 10*3/uL (ref 3.9–10.3)

## 2014-07-07 LAB — COMPREHENSIVE METABOLIC PANEL (CC13)
ALT: 20 U/L (ref 0–55)
ANION GAP: 10 meq/L (ref 3–11)
AST: 16 U/L (ref 5–34)
Albumin: 3.7 g/dL (ref 3.5–5.0)
Alkaline Phosphatase: 74 U/L (ref 40–150)
BUN: 23.9 mg/dL (ref 7.0–26.0)
CALCIUM: 9.5 mg/dL (ref 8.4–10.4)
CHLORIDE: 104 meq/L (ref 98–109)
CO2: 25 meq/L (ref 22–29)
CREATININE: 1 mg/dL (ref 0.6–1.1)
EGFR: 54 mL/min/{1.73_m2} — ABNORMAL LOW (ref >90)
GLUCOSE: 81 mg/dL (ref 70–140)
Potassium: 4 mEq/L (ref 3.5–5.1)
Sodium: 139 mEq/L (ref 136–145)
TOTAL PROTEIN: 6.6 g/dL (ref 6.4–8.3)
Total Bilirubin: 0.47 mg/dL (ref 0.20–1.20)

## 2014-07-07 NOTE — Telephone Encounter (Signed)
Appointments made and avs printed for patient °

## 2014-07-07 NOTE — Progress Notes (Signed)
Patient Care Team: Lajean Manes, MD as PCP - General (Internal Medicine)  DIAGNOSIS: Neoplasm of left breast, primary tumor staging category Tis: ductal carcinoma in situ (DCIS)   Staging form: Breast, AJCC 7th Edition     Clinical: Tis (DCIS) - Unsigned     Pathologic: TisN0 (Tis (DCIS)) - Signed by Rulon Eisenmenger, MD on 08/28/2013   SUMMARY OF ONCOLOGIC HISTORY:   Neoplasm of left breast, primary tumor staging category Tis: ductal carcinoma in situ (DCIS)   06/24/2012 Surgery Left breast lumpectomy and sentinel lymph node biopsy DCIS   08/30/2012 - 09/28/2012 Radiation Therapy Radiation to lumpectomy site   10/02/2012 - 10/02/2013 Anti-estrogen oral therapy Tamoxifen 20 mg daily started 10/02/2012 discontinued due to superficial leg vein thrombosis. Aromasin 25 mg started July 2015 stopped for muscle aches, restarted 08/28/2013 stopped in 10/02/13 due to elevation AST adn ALT   10/02/2013 -  Anti-estrogen oral therapy Arimidex 1 mg daily    CHIEF COMPLIANT: Follow-up on anastrozole  INTERVAL HISTORY: Laurie Gomez is a 74 year old with above-mentioned history of DCIS currently on anastrozole. She is tolerating this much better. She is taken it since October 2014. She does have muscle stiffness associated with this medication. Recently she tore hamstring muscle and just undergone physical therapy and she is feeling better. Her last mammogram done in April was normal.  REVIEW OF SYSTEMS:   Constitutional: Denies fevers, chills or abnormal weight loss Eyes: Denies blurriness of vision Ears, nose, mouth, throat, and face: Denies mucositis or sore throat Respiratory: Denies cough, dyspnea or wheezes Cardiovascular: Denies palpitation, chest discomfort or lower extremity swelling Gastrointestinal:  Denies nausea, heartburn or change in bowel habits Skin: Denies abnormal skin rashes Lymphatics: Denies new lymphadenopathy or easy bruising Neurological:Denies numbness, tingling or new  weaknesses Behavioral/Psych: Mood is stable, no new changes  Breast:  denies any pain or lumps or nodules in either breasts All other systems were reviewed with the patient and are negative.  I have reviewed the past medical history, past surgical history, social history and family history with the patient and they are unchanged from previous note.  ALLERGIES:  has No Known Allergies.  MEDICATIONS:  Current Outpatient Prescriptions  Medication Sig Dispense Refill  . amlodipine-benazepril (LOTREL) 2.5-10 MG per capsule   3  . anastrozole (ARIMIDEX) 1 MG tablet Take 1 tablet (1 mg total) by mouth daily. 90 tablet 6  . Calcium Carbonate-Vitamin D 600-125 MG-UNIT TABS Take 1 tablet by mouth daily.     Marland Kitchen levothyroxine (SYNTHROID) 75 MCG tablet Take 75 mcg by mouth daily before breakfast.     . Multiple Vitamin (MULTIVITAMIN) tablet Take 1 tablet by mouth daily.     . naproxen sodium (ANAPROX) 220 MG tablet Take 220 mg by mouth as needed (pain).    . Psyllium (WAL-MUCIL PO) Take 2 tablets by mouth daily.    Marland Kitchen triamterene-hydrochlorothiazide (MAXZIDE) 75-50 MG per tablet Take 1 tablet by mouth daily. Take 1/2 tablet daily.    . vitamin B-12 (CYANOCOBALAMIN) 50 MCG tablet Take 2 tablets by mouth daily.      No current facility-administered medications for this visit.    PHYSICAL EXAMINATION: ECOG PERFORMANCE STATUS: 1 - Symptomatic but completely ambulatory  Filed Vitals:   07/07/14 0842  BP: 135/68  Pulse: 72  Temp: 98.1 F (36.7 C)  Resp: 18   Filed Weights   07/07/14 0842  Weight: 181 lb 1.6 oz (82.146 kg)    GENERAL:alert, no distress and comfortable SKIN: skin  color, texture, turgor are normal, no rashes or significant lesions EYES: normal, Conjunctiva are pink and non-injected, sclera clear OROPHARYNX:no exudate, no erythema and lips, buccal mucosa, and tongue normal  NECK: supple, thyroid normal size, non-tender, without nodularity LYMPH:  no palpable lymphadenopathy in  the cervical, axillary or inguinal LUNGS: clear to auscultation and percussion with normal breathing effort HEART: regular rate & rhythm and no murmurs and no lower extremity edema ABDOMEN:abdomen soft, non-tender and normal bowel sounds Musculoskeletal:no cyanosis of digits and no clubbing  NEURO: alert & oriented x 3 with fluent speech, no focal motor/sensory deficits BREAST: No palpable masses or nodules in either right or left breasts. No palpable axillary supraclavicular or infraclavicular adenopathy no breast tenderness or nipple discharge. (exam performed in the presence of a chaperone)  LABORATORY DATA:  I have reviewed the data as listed   Chemistry      Component Value Date/Time   NA 139 02/03/2014 0853   NA 139 12/26/2013 0435   K 3.9 02/03/2014 0853   K 3.9 12/26/2013 0435   CL 107 12/26/2013 0435   CO2 27 02/03/2014 0853   CO2 25 12/26/2013 0435   BUN 20.2 02/03/2014 0853   BUN 11 12/26/2013 0435   CREATININE 1.0 02/03/2014 0853   CREATININE 1.11* 12/26/2013 0435      Component Value Date/Time   CALCIUM 9.4 02/03/2014 0853   CALCIUM 8.6 12/26/2013 0435   ALKPHOS 78 02/03/2014 0853   ALKPHOS 69 12/25/2013 0252   AST 25 02/03/2014 0853   AST 28 12/25/2013 0252   ALT 36 02/03/2014 0853   ALT 19 12/25/2013 0252   BILITOT 0.45 02/03/2014 0853   BILITOT 1.4* 12/25/2013 0252       Lab Results  Component Value Date   WBC 6.2 07/07/2014   HGB 13.3 07/07/2014   HCT 39.6 07/07/2014   MCV 88.5 07/07/2014   PLT 183 07/07/2014   NEUTROABS 2.8 07/07/2014     RADIOGRAPHIC STUDIES: I have personally reviewed the radiology reports and agreed with their findings. Mammograms in April 2016 were normal  ASSESSMENT & PLAN:  Neoplasm of left breast, primary tumor staging category Tis: ductal carcinoma in situ (DCIS) DCIS left breast status post lumpectomy and radiation, could not tolerate tamoxifen because of superficial venous thrombosis currently on  Arimidex.  Elevated ALT and AST and alkaline phosphatase: Ultrasound of the liver showed fatty liver. These abnormal blood test had normalized after stopping Aromasin and switching her to Arimidex. Today her liver function tests are normal.  Breast Cancer Surveillance: 1. Breast exam 07/07/2014: Normal 2. Mammogram 04/15/2014 No abnormalities. Postsurgical changes. Breast Density Category B. I recommended that she get 3-D mammograms for surveillance. Discussed the differences between different breast density categories.    Anastrozole toxicities: 1. Muscle aches and pains Patient was admitted for diverticulitis Christmas 2015 and took antibiotics for a couple of weeks. Patient tore hamstring muscle and had finished physical therapy and starting to get better  Return to clinic in 6 months for follow-up.    No orders of the defined types were placed in this encounter.   The patient has a good understanding of the overall plan. she agrees with it. she will call with any problems that may develop before the next visit here.   Rulon Eisenmenger, MD

## 2014-07-07 NOTE — Assessment & Plan Note (Signed)
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  Elevated ALT and AST and alkaline phosphatase: Ultrasound of the liver showed fatty liver. These abnormal blood test had normalized after stopping Aromasin and switching her to Arimidex. Today her liver function tests are normal.  Breast Cancer Surveillance: 1. Breast exam 07/07/2014: Normal 2. Mammogram 04/15/2014 No abnormalities. Postsurgical changes. Breast Density Category B. I recommended that she get 3-D mammograms for surveillance. Discussed the differences between different breast density categories.    Anastrozole toxicities: 1. Muscle aches and pains Patient was admitted for diverticulitis Christmas 2015 and took antibiotics for a couple of weeks.  Return to clinic in 6 months for follow-up.

## 2014-07-16 DIAGNOSIS — R1084 Generalized abdominal pain: Secondary | ICD-10-CM | POA: Diagnosis not present

## 2014-08-26 ENCOUNTER — Other Ambulatory Visit: Payer: Self-pay | Admitting: Orthopaedic Surgery

## 2014-08-26 ENCOUNTER — Ambulatory Visit (HOSPITAL_COMMUNITY)
Admission: RE | Admit: 2014-08-26 | Discharge: 2014-08-26 | Disposition: A | Discharge status: Home / Self Care | Payer: Medicare Other | Source: Ambulatory Visit | Attending: Cardiology | Admitting: Cardiology

## 2014-08-26 DIAGNOSIS — M25562 Pain in left knee: Secondary | ICD-10-CM | POA: Diagnosis not present

## 2014-08-26 DIAGNOSIS — M7989 Other specified soft tissue disorders: Principal | ICD-10-CM

## 2014-08-26 DIAGNOSIS — M79662 Pain in left lower leg: Secondary | ICD-10-CM

## 2014-08-26 DIAGNOSIS — M79605 Pain in left leg: Secondary | ICD-10-CM | POA: Diagnosis not present

## 2014-09-02 DIAGNOSIS — M25562 Pain in left knee: Secondary | ICD-10-CM | POA: Diagnosis not present

## 2014-09-08 DIAGNOSIS — Z6830 Body mass index (BMI) 30.0-30.9, adult: Secondary | ICD-10-CM | POA: Diagnosis not present

## 2014-09-08 DIAGNOSIS — E039 Hypothyroidism, unspecified: Secondary | ICD-10-CM | POA: Diagnosis not present

## 2014-09-08 DIAGNOSIS — E669 Obesity, unspecified: Secondary | ICD-10-CM | POA: Diagnosis not present

## 2014-09-08 DIAGNOSIS — Z79899 Other long term (current) drug therapy: Secondary | ICD-10-CM | POA: Diagnosis not present

## 2014-09-08 DIAGNOSIS — N183 Chronic kidney disease, stage 3 (moderate): Secondary | ICD-10-CM | POA: Diagnosis not present

## 2014-09-08 DIAGNOSIS — I129 Hypertensive chronic kidney disease with stage 1 through stage 4 chronic kidney disease, or unspecified chronic kidney disease: Secondary | ICD-10-CM | POA: Diagnosis not present

## 2014-09-15 DIAGNOSIS — M25562 Pain in left knee: Secondary | ICD-10-CM | POA: Diagnosis not present

## 2014-09-24 DIAGNOSIS — M25562 Pain in left knee: Secondary | ICD-10-CM | POA: Diagnosis not present

## 2014-11-18 DIAGNOSIS — Z23 Encounter for immunization: Secondary | ICD-10-CM | POA: Diagnosis not present

## 2014-12-08 ENCOUNTER — Ambulatory Visit (HOSPITAL_BASED_OUTPATIENT_CLINIC_OR_DEPARTMENT_OTHER): Payer: Medicare Other | Admitting: Hematology and Oncology

## 2014-12-08 ENCOUNTER — Encounter: Payer: Self-pay | Admitting: Hematology and Oncology

## 2014-12-08 ENCOUNTER — Telehealth: Payer: Self-pay | Admitting: Hematology and Oncology

## 2014-12-08 VITALS — BP 113/67 | HR 70 | Resp 98 | Ht 65.0 in | Wt 181.3 lb

## 2014-12-08 DIAGNOSIS — R945 Abnormal results of liver function studies: Secondary | ICD-10-CM | POA: Diagnosis not present

## 2014-12-08 DIAGNOSIS — K760 Fatty (change of) liver, not elsewhere classified: Secondary | ICD-10-CM | POA: Diagnosis not present

## 2014-12-08 DIAGNOSIS — D0512 Intraductal carcinoma in situ of left breast: Secondary | ICD-10-CM

## 2014-12-08 DIAGNOSIS — R748 Abnormal levels of other serum enzymes: Secondary | ICD-10-CM

## 2014-12-08 MED ORDER — SACCHAROMYCES BOULARDII 250 MG PO CAPS: 250.0000 mg | ORAL_CAPSULE | Freq: Two times a day (BID) | ORAL | Status: AC

## 2014-12-08 NOTE — Addendum Note (Signed)
Addended by: Prentiss Bells on: 12/08/2014 11:58 AM   Modules accepted: Medications

## 2014-12-08 NOTE — Telephone Encounter (Signed)
Gave patient avs report appointment for June and bone density test for January @ [facility].

## 2014-12-08 NOTE — Progress Notes (Signed)
Patient Care Team: Lajean Manes, MD as PCP - General (Internal Medicine)  DIAGNOSIS: Neoplasm of left breast, primary tumor staging category Tis: ductal carcinoma in situ (DCIS)   Staging form: Breast, AJCC 7th Edition     Clinical: Tis (DCIS) - Unsigned     Pathologic: TisN0 (Tis (DCIS)) - Signed by Rulon Eisenmenger, MD on 08/28/2013   SUMMARY OF ONCOLOGIC HISTORY:   Neoplasm of left breast, primary tumor staging category Tis: ductal carcinoma in situ (DCIS)   06/24/2012 Surgery Left breast lumpectomy and sentinel lymph node biopsy DCIS   08/30/2012 - 09/28/2012 Radiation Therapy Radiation to lumpectomy site   10/02/2012 - 10/02/2013 Anti-estrogen oral therapy Tamoxifen 20 mg daily started 10/02/2012 discontinued due to superficial leg vein thrombosis. Aromasin 25 mg started July 2015 stopped for muscle aches, restarted 08/28/2013 stopped in 10/02/13 due to elevation AST adn ALT   10/02/2013 -  Anti-estrogen oral therapy Arimidex 1 mg daily    CHIEF COMPLIANT: follow-up on Arimidex therapy  INTERVAL HISTORY: Laurie Gomez is a 74 year old with above-mentioned history of left breast DCIS initially treated with tamoxifen now on Arimidex. She has been on antiestrogen therapy for the past 2 years. She complains that Arimidex causes muscle stiffness especially in the morning. She has been exercising and staying active which has been helping her.  REVIEW OF SYSTEMS:   Constitutional: Denies fevers, chills or abnormal weight loss Eyes: Denies blurriness of vision Ears, nose, mouth, throat, and face: Denies mucositis or sore throat Respiratory: Denies cough, dyspnea or wheezes Cardiovascular: Denies palpitation, chest discomfort or lower extremity swelling Gastrointestinal:  Denies nausea, heartburn or change in bowel habits Skin: Denies abnormal skin rashes Lymphatics: Denies new lymphadenopathy or easy bruising Neurological:muscle stiffness Behavioral/Psych: Mood is stable, no new changes    Breast:  denies any pain or lumps or nodules in either breasts All other systems were reviewed with the patient and are negative.  I have reviewed the past medical history, past surgical history, social history and family history with the patient and they are unchanged from previous note.  ALLERGIES:  has No Known Allergies.  MEDICATIONS:  Current Outpatient Prescriptions  Medication Sig Dispense Refill  . amlodipine-benazepril (LOTREL) 2.5-10 MG per capsule   3  . anastrozole (ARIMIDEX) 1 MG tablet Take 1 tablet (1 mg total) by mouth daily. 90 tablet 6  . Calcium Carbonate-Vitamin D 600-125 MG-UNIT TABS Take 1 tablet by mouth daily.     Marland Kitchen levothyroxine (SYNTHROID) 75 MCG tablet Take 75 mcg by mouth daily before breakfast.     . Multiple Vitamin (MULTIVITAMIN) tablet Take 1 tablet by mouth daily.     . naproxen sodium (ANAPROX) 220 MG tablet Take 220 mg by mouth as needed (pain).    . Psyllium (WAL-MUCIL PO) Take 2 tablets by mouth daily.    Marland Kitchen triamterene-hydrochlorothiazide (MAXZIDE) 75-50 MG per tablet Take 1 tablet by mouth daily. Take 1/2 tablet daily.    . vitamin B-12 (CYANOCOBALAMIN) 50 MCG tablet Take 2 tablets by mouth daily.      No current facility-administered medications for this visit.    PHYSICAL EXAMINATION: ECOG PERFORMANCE STATUS: 0 - Asymptomatic  Filed Vitals:   12/08/14 0855  BP: 113/67  Pulse: 70  Resp: 98   Filed Weights   12/08/14 0855  Weight: 181 lb 4.8 oz (82.237 kg)    GENERAL:alert, no distress and comfortable SKIN: skin color, texture, turgor are normal, no rashes or significant lesions EYES: normal, Conjunctiva are pink  and non-injected, sclera clear OROPHARYNX:no exudate, no erythema and lips, buccal mucosa, and tongue normal  NECK: supple, thyroid normal size, non-tender, without nodularity LYMPH:  no palpable lymphadenopathy in the cervical, axillary or inguinal LUNGS: clear to auscultation and percussion with normal breathing  effort HEART: regular rate & rhythm and no murmurs and no lower extremity edema ABDOMEN:abdomen soft, non-tender and normal bowel sounds Musculoskeletal:no cyanosis of digits and no clubbing  NEURO: alert & oriented x 3 with fluent speech, no focal motor/sensory deficits BREAST:the right chest wall lipoma, left chest wall lipoma as well the left breast shows effects of prior surgery and radiation and is dense to palpation. No palpable masses or lumps of concern. The lipomas have been present for a long time.. (exam performed in the presence of a chaperone)  LABORATORY DATA:  I have reviewed the data as listed   Chemistry      Component Value Date/Time   NA 139 07/07/2014 0822   NA 139 12/26/2013 0435   K 4.0 07/07/2014 0822   K 3.9 12/26/2013 0435   CL 107 12/26/2013 0435   CO2 25 07/07/2014 0822   CO2 25 12/26/2013 0435   BUN 23.9 07/07/2014 0822   BUN 11 12/26/2013 0435   CREATININE 1.0 07/07/2014 0822   CREATININE 1.11* 12/26/2013 0435      Component Value Date/Time   CALCIUM 9.5 07/07/2014 0822   CALCIUM 8.6 12/26/2013 0435   ALKPHOS 74 07/07/2014 0822   ALKPHOS 69 12/25/2013 0252   AST 16 07/07/2014 0822   AST 28 12/25/2013 0252   ALT 20 07/07/2014 0822   ALT 19 12/25/2013 0252   BILITOT 0.47 07/07/2014 0822   BILITOT 1.4* 12/25/2013 0252       Lab Results  Component Value Date   WBC 6.2 07/07/2014   HGB 13.3 07/07/2014   HCT 39.6 07/07/2014   MCV 88.5 07/07/2014   PLT 183 07/07/2014   NEUTROABS 2.8 07/07/2014   ASSESSMENT & PLAN:  Neoplasm of left breast, primary tumor staging category Tis: ductal carcinoma in situ (DCIS) DCIS left breast status post lumpectomy and radiation, could not tolerate tamoxifen because of superficial venous thrombosis (started 10/02/2012) currently on Arimidex since 10/02/2013.  Elevated ALT and AST and alkaline phosphatase: Ultrasound of the liver showed fatty liver. These abnormal blood test had normalized after stopping Aromasin  and switching her to Arimidex. Her primary care physician is monitoring her liver.  Breast Cancer Surveillance: 1. Breast exam 07/07/2014: no concerning findings. 2. Mammogram 04/15/2014 No abnormalities. Postsurgical changes. Breast Density Category B. I recommended that she get 3-D mammograms for surveillance. Discussed the differences between different breast density categories. 3. Patient will need a bone density test to be done this year. I will send a referral to breast center  Anastrozole toxicities: 1. Muscle aches and pains Patient was admitted for diverticulitis Christmas 2015: Not a problem anymore. She takes probiotics Patient tore hamstring muscle  Return to clinic in 6 months for follow-up.   No orders of the defined types were placed in this encounter.   The patient has a good understanding of the overall plan. she agrees with it. she will call with any problems that may develop before the next visit here.   Rulon Eisenmenger, MD 12/08/2014

## 2014-12-08 NOTE — Assessment & Plan Note (Signed)
DCIS left breast status post lumpectomy and radiation, could not tolerate tamoxifen because of superficial venous thrombosis currently on Arimidex since 10/02/2013.  Elevated ALT and AST and alkaline phosphatase: Ultrasound of the liver showed fatty liver. These abnormal blood test had normalized after stopping Aromasin and switching her to Arimidex. Today her liver function tests are normal.  Breast Cancer Surveillance: 1. Breast exam 07/07/2014: Normal 2. Mammogram 04/15/2014 No abnormalities. Postsurgical changes. Breast Density Category B. I recommended that she get 3-D mammograms for surveillance. Discussed the differences between different breast density categories.  Anastrozole toxicities: 1. Muscle aches and pains Patient was admitted for diverticulitis Christmas 2015 and took antibiotics for a couple of weeks. Patient tore hamstring muscle and had finished physical therapy and starting to get better  Return to clinic in 6 months for follow-up.

## 2015-01-01 DIAGNOSIS — K5792 Diverticulitis of intestine, part unspecified, without perforation or abscess without bleeding: Secondary | ICD-10-CM | POA: Diagnosis not present

## 2015-01-08 ENCOUNTER — Ambulatory Visit
Admission: RE | Admit: 2015-01-08 | Discharge: 2015-01-08 | Disposition: A | Discharge status: Home / Self Care | Payer: Medicare Other | Source: Ambulatory Visit | Attending: Hematology and Oncology | Admitting: Hematology and Oncology

## 2015-01-08 DIAGNOSIS — M810 Age-related osteoporosis without current pathological fracture: Secondary | ICD-10-CM | POA: Diagnosis not present

## 2015-01-08 DIAGNOSIS — D0512 Intraductal carcinoma in situ of left breast: Secondary | ICD-10-CM

## 2015-01-13 DIAGNOSIS — M25562 Pain in left knee: Secondary | ICD-10-CM | POA: Diagnosis not present

## 2015-01-15 ENCOUNTER — Other Ambulatory Visit: Payer: Self-pay | Admitting: Hematology and Oncology

## 2015-01-15 NOTE — Telephone Encounter (Signed)
Chart reviewed.

## 2015-02-10 DIAGNOSIS — M25562 Pain in left knee: Secondary | ICD-10-CM | POA: Diagnosis not present

## 2015-03-09 ENCOUNTER — Other Ambulatory Visit: Payer: Self-pay | Admitting: Hematology and Oncology

## 2015-03-09 DIAGNOSIS — Z853 Personal history of malignant neoplasm of breast: Secondary | ICD-10-CM

## 2015-04-14 DIAGNOSIS — Z79899 Other long term (current) drug therapy: Secondary | ICD-10-CM | POA: Diagnosis not present

## 2015-04-14 DIAGNOSIS — Z1389 Encounter for screening for other disorder: Secondary | ICD-10-CM | POA: Diagnosis not present

## 2015-04-14 DIAGNOSIS — R5383 Other fatigue: Secondary | ICD-10-CM | POA: Diagnosis not present

## 2015-04-14 DIAGNOSIS — N183 Chronic kidney disease, stage 3 (moderate): Secondary | ICD-10-CM | POA: Diagnosis not present

## 2015-04-14 DIAGNOSIS — C50912 Malignant neoplasm of unspecified site of left female breast: Secondary | ICD-10-CM | POA: Diagnosis not present

## 2015-04-14 DIAGNOSIS — Z6831 Body mass index (BMI) 31.0-31.9, adult: Secondary | ICD-10-CM | POA: Diagnosis not present

## 2015-04-14 DIAGNOSIS — M25562 Pain in left knee: Secondary | ICD-10-CM | POA: Diagnosis not present

## 2015-04-14 DIAGNOSIS — Z0000 Encounter for general adult medical examination without abnormal findings: Secondary | ICD-10-CM | POA: Diagnosis not present

## 2015-04-14 DIAGNOSIS — I129 Hypertensive chronic kidney disease with stage 1 through stage 4 chronic kidney disease, or unspecified chronic kidney disease: Secondary | ICD-10-CM | POA: Diagnosis not present

## 2015-04-14 DIAGNOSIS — E669 Obesity, unspecified: Secondary | ICD-10-CM | POA: Diagnosis not present

## 2015-04-14 DIAGNOSIS — E039 Hypothyroidism, unspecified: Secondary | ICD-10-CM | POA: Diagnosis not present

## 2015-04-19 ENCOUNTER — Ambulatory Visit
Admission: RE | Admit: 2015-04-19 | Discharge: 2015-04-19 | Disposition: A | Discharge status: Home / Self Care | Payer: Medicare Other | Source: Ambulatory Visit | Attending: Hematology and Oncology | Admitting: Hematology and Oncology

## 2015-04-19 DIAGNOSIS — R928 Other abnormal and inconclusive findings on diagnostic imaging of breast: Secondary | ICD-10-CM | POA: Diagnosis not present

## 2015-04-19 DIAGNOSIS — Z853 Personal history of malignant neoplasm of breast: Secondary | ICD-10-CM

## 2015-06-08 ENCOUNTER — Encounter: Payer: Self-pay | Admitting: Hematology and Oncology

## 2015-06-08 ENCOUNTER — Telehealth: Payer: Self-pay | Admitting: Hematology and Oncology

## 2015-06-08 ENCOUNTER — Ambulatory Visit (HOSPITAL_BASED_OUTPATIENT_CLINIC_OR_DEPARTMENT_OTHER): Payer: Medicare Other | Admitting: Hematology and Oncology

## 2015-06-08 VITALS — BP 151/61 | HR 60 | Temp 98.5°F | Resp 18 | Ht 65.0 in | Wt 178.9 lb

## 2015-06-08 DIAGNOSIS — D0512 Intraductal carcinoma in situ of left breast: Secondary | ICD-10-CM | POA: Diagnosis not present

## 2015-06-08 DIAGNOSIS — M810 Age-related osteoporosis without current pathological fracture: Secondary | ICD-10-CM | POA: Diagnosis not present

## 2015-06-08 DIAGNOSIS — K760 Fatty (change of) liver, not elsewhere classified: Secondary | ICD-10-CM | POA: Diagnosis not present

## 2015-06-08 NOTE — Telephone Encounter (Signed)
appt made and avs printed °

## 2015-06-08 NOTE — Assessment & Plan Note (Signed)
DCIS left breast status post lumpectomy and radiation, could not tolerate tamoxifen because of superficial venous thrombosis (started 10/02/2012) currently on Arimidex since 10/02/2013.  Elevated ALT and AST and alkaline phosphatase: Ultrasound of the liver showed fatty liver. These abnormal blood test had normalized after stopping Aromasin and switching her to Arimidex. Her primary care physician is monitoring her liver.  Breast Cancer Surveillance: 1. Breast exam : 06/08/15 no concerning findings. 2. Mammogram 04/19/2015 No abnormalities. Postsurgical changes. Breast Density Category B. I recommended that she get 3-D mammograms for surveillance. Discussed the differences between different breast density categories. 3. Bone density: Osteoporosis T score at -2.5 Recommended bisphosphonate therapy. Discussed the options of oral therapy versus injection therapy with Prolia.  Anastrozole toxicities: 1. Muscle aches and pains She takes probiotics Patient tore hamstring muscle  Return to clinic in 1year for follow-up.

## 2015-06-08 NOTE — Progress Notes (Signed)
Patient Care Team: Lajean Manes, MD as PCP - General (Internal Medicine)  DIAGNOSIS: Neoplasm of left breast, primary tumor staging category Tis: ductal carcinoma in situ (DCIS)   Staging form: Breast, AJCC 7th Edition     Clinical: Tis (DCIS) - Unsigned     Pathologic: TisN0 (Tis (DCIS)) - Signed by Rulon Eisenmenger, MD on 08/28/2013   SUMMARY OF ONCOLOGIC HISTORY:   Neoplasm of left breast, primary tumor staging category Tis: ductal carcinoma in situ (DCIS)   06/24/2012 Surgery Left breast lumpectomy and sentinel lymph node biopsy DCIS   08/30/2012 - 09/28/2012 Radiation Therapy Radiation to lumpectomy site   10/02/2012 - 10/02/2013 Anti-estrogen oral therapy Tamoxifen 20 mg daily started 10/02/2012 discontinued due to superficial leg vein thrombosis. Aromasin 25 mg started July 2015 stopped for muscle aches, restarted 08/28/2013 stopped in 10/02/13 due to elevation AST adn ALT   10/02/2013 -  Anti-estrogen oral therapy Arimidex 1 mg daily   CHIEF COMPLIANT: Follow-up on anastrozole  INTERVAL HISTORY: Laurie Gomez is a 75 year old with above-mentioned history of left breast cancer currently on antiestrogen therapy with anastrozole. She is tolerating anastrozole moderately well. She has episodes of diffuse myalgias and she had to stop treatment for about 4 weeks with complete resolution of symptoms and she resumed it she currently now has bilateral hip discomfort but overall she appears to be tolerating it fairly well. He reports that she has chronic leg swelling especially when the temperature gets hotter. Denies any hot flashes. Denies any myalgias. She was found to have osteoporosis on bone density.  REVIEW OF SYSTEMS:   Constitutional: Denies fevers, chills or abnormal weight loss Eyes: Denies blurriness of vision Ears, nose, mouth, throat, and face: Denies mucositis or sore throat Respiratory: Denies cough, dyspnea or wheezes Cardiovascular: Denies palpitation, chest  discomfort Gastrointestinal:  Denies nausea, heartburn or change in bowel habits Skin: Denies abnormal skin rashes Lymphatics: Denies new lymphadenopathy or easy bruising Neurological:Denies numbness, tingling or new weaknesses Behavioral/Psych: Mood is stable, no new changes  Extremities: 1+ bilateral lower extremity edema Breast:  denies any pain or lumps or nodules in either breasts All other systems were reviewed with the patient and are negative.  I have reviewed the past medical history, past surgical history, social history and family history with the patient and they are unchanged from previous note.  ALLERGIES:  has No Known Allergies.  MEDICATIONS:  Current Outpatient Prescriptions  Medication Sig Dispense Refill  . amlodipine-benazepril (LOTREL) 2.5-10 MG per capsule   3  . anastrozole (ARIMIDEX) 1 MG tablet TAKE 1 TABLET BY MOUTH ONCE DAILY 90 tablet 1  . Calcium Carbonate-Vitamin D 600-125 MG-UNIT TABS Take 1 tablet by mouth daily.     Marland Kitchen levothyroxine (SYNTHROID) 75 MCG tablet Take 75 mcg by mouth daily before breakfast.     . Multiple Vitamin (MULTIVITAMIN) tablet Take 1 tablet by mouth daily.     . Psyllium (WAL-MUCIL PO) Take 2 tablets by mouth daily.    Marland Kitchen saccharomyces boulardii (FLORASTOR) 250 MG capsule Take 1 capsule (250 mg total) by mouth 2 (two) times daily.    Marland Kitchen triamterene-hydrochlorothiazide (MAXZIDE) 75-50 MG per tablet Take 1 tablet by mouth daily. Take 1/2 tablet daily.    . vitamin B-12 (CYANOCOBALAMIN) 50 MCG tablet Take 2 tablets by mouth daily.      No current facility-administered medications for this visit.    PHYSICAL EXAMINATION: ECOG PERFORMANCE STATUS: 1 - Symptomatic but completely ambulatory  Filed Vitals:   06/08/15  0859  BP: 151/61  Pulse: 60  Temp: 98.5 F (36.9 C)  Resp: 18   Filed Weights   06/08/15 0859  Weight: 178 lb 14.4 oz (81.149 kg)    GENERAL:alert, no distress and comfortable SKIN: skin color, texture, turgor are  normal, no rashes or significant lesions EYES: normal, Conjunctiva are pink and non-injected, sclera clear OROPHARYNX:no exudate, no erythema and lips, buccal mucosa, and tongue normal  NECK: supple, thyroid normal size, non-tender, without nodularity LYMPH:  no palpable lymphadenopathy in the cervical, axillary or inguinal LUNGS: clear to auscultation and percussion with normal breathing effort HEART: regular rate & rhythm and no murmurs and no lower extremity edema ABDOMEN:abdomen soft, non-tender and normal bowel sounds MUSCULOSKELETAL:no cyanosis of digits and no clubbing  NEURO: alert & oriented x 3 with fluent speech, no focal motor/sensory deficits EXTREMITIES: 1+ bilateral lower extremity edema BREAST: No palpable masses or nodules in either right or left breasts. No palpable axillary supraclavicular or infraclavicular adenopathy no breast tenderness or nipple discharge. (exam performed in the presence of a chaperone)  LABORATORY DATA:  I have reviewed the data as listed   Chemistry      Component Value Date/Time   NA 139 07/07/2014 0822   NA 139 12/26/2013 0435   K 4.0 07/07/2014 0822   K 3.9 12/26/2013 0435   CL 107 12/26/2013 0435   CO2 25 07/07/2014 0822   CO2 25 12/26/2013 0435   BUN 23.9 07/07/2014 0822   BUN 11 12/26/2013 0435   CREATININE 1.0 07/07/2014 0822   CREATININE 1.11* 12/26/2013 0435      Component Value Date/Time   CALCIUM 9.5 07/07/2014 0822   CALCIUM 8.6 12/26/2013 0435   ALKPHOS 74 07/07/2014 0822   ALKPHOS 69 12/25/2013 0252   AST 16 07/07/2014 0822   AST 28 12/25/2013 0252   ALT 20 07/07/2014 0822   ALT 19 12/25/2013 0252   BILITOT 0.47 07/07/2014 0822   BILITOT 1.4* 12/25/2013 0252       Lab Results  Component Value Date   WBC 6.2 07/07/2014   HGB 13.3 07/07/2014   HCT 39.6 07/07/2014   MCV 88.5 07/07/2014   PLT 183 07/07/2014   NEUTROABS 2.8 07/07/2014     ASSESSMENT & PLAN:  Neoplasm of left breast, primary tumor staging  category Tis: ductal carcinoma in situ (DCIS) DCIS left breast status post lumpectomy and radiation, could not tolerate tamoxifen because of superficial venous thrombosis (started 10/02/2012) currently on Arimidex since 10/02/2013.  Elevated ALT and AST and alkaline phosphatase: Ultrasound of the liver showed fatty liver. These abnormal blood test had normalized after stopping Aromasin and switching her to Arimidex. Her primary care physician is monitoring her liver.  Breast Cancer Surveillance: 1. Breast exam : 06/08/15 no concerning findings. 2. Mammogram 04/19/2015 No abnormalities. Postsurgical changes. Breast Density Category B. I recommended that she get 3-D mammograms for surveillance. Discussed the differences between different breast density categories. 3. Bone density: Osteoporosis T score at -2.5 Recommended bisphosphonate therapy. Discussed the options of oral therapy versus injection therapy with Prolia. Patient chose to receive Prolia every 6 months starting next week  Anastrozole toxicities: 1. Muscle aches and pains: Intermittently she stops anastrozole which seems to have helped.  She takes probiotics Patient tore hamstring muscle  Return to clinic in 6 months for follow-up.    No orders of the defined types were placed in this encounter.   The patient has a good understanding of the overall plan. she agrees  with it. she will call with any problems that may develop before the next visit here.   Rulon Eisenmenger, MD 06/08/2015

## 2015-06-14 ENCOUNTER — Other Ambulatory Visit: Payer: Self-pay

## 2015-06-14 DIAGNOSIS — D0512 Intraductal carcinoma in situ of left breast: Secondary | ICD-10-CM

## 2015-06-15 ENCOUNTER — Ambulatory Visit (HOSPITAL_BASED_OUTPATIENT_CLINIC_OR_DEPARTMENT_OTHER): Payer: Medicare Other

## 2015-06-15 ENCOUNTER — Other Ambulatory Visit (HOSPITAL_BASED_OUTPATIENT_CLINIC_OR_DEPARTMENT_OTHER): Payer: Medicare Other

## 2015-06-15 VITALS — BP 143/62 | HR 63 | Temp 97.7°F | Resp 18

## 2015-06-15 DIAGNOSIS — D0512 Intraductal carcinoma in situ of left breast: Secondary | ICD-10-CM | POA: Diagnosis not present

## 2015-06-15 DIAGNOSIS — M810 Age-related osteoporosis without current pathological fracture: Secondary | ICD-10-CM | POA: Diagnosis not present

## 2015-06-15 LAB — CBC WITH DIFFERENTIAL/PLATELET
BASO%: 0.9 % (ref 0.0–2.0)
BASOS ABS: 0.1 10*3/uL (ref 0.0–0.1)
EOS%: 1.3 % (ref 0.0–7.0)
Eosinophils Absolute: 0.1 10*3/uL (ref 0.0–0.5)
HEMATOCRIT: 41.5 % (ref 34.8–46.6)
HGB: 13.7 g/dL (ref 11.6–15.9)
LYMPH#: 2.5 10*3/uL (ref 0.9–3.3)
LYMPH%: 36.6 % (ref 14.0–49.7)
MCH: 29.3 pg (ref 25.1–34.0)
MCHC: 33 g/dL (ref 31.5–36.0)
MCV: 88.7 fL (ref 79.5–101.0)
MONO#: 0.7 10*3/uL (ref 0.1–0.9)
MONO%: 9.9 % (ref 0.0–14.0)
NEUT#: 3.5 10*3/uL (ref 1.5–6.5)
NEUT%: 51.3 % (ref 38.4–76.8)
Platelets: 170 10*3/uL (ref 145–400)
RBC: 4.69 10*6/uL (ref 3.70–5.45)
RDW: 13.6 % (ref 11.2–14.5)
WBC: 6.9 10*3/uL (ref 3.9–10.3)

## 2015-06-15 LAB — COMPREHENSIVE METABOLIC PANEL
ALT: 20 U/L (ref 0–55)
AST: 15 U/L (ref 5–34)
Albumin: 3.8 g/dL (ref 3.5–5.0)
Alkaline Phosphatase: 76 U/L (ref 40–150)
Anion Gap: 9 mEq/L (ref 3–11)
BUN: 18 mg/dL (ref 7.0–26.0)
CALCIUM: 9.5 mg/dL (ref 8.4–10.4)
CO2: 25 mEq/L (ref 22–29)
Chloride: 105 mEq/L (ref 98–109)
Creatinine: 1.1 mg/dL (ref 0.6–1.1)
EGFR: 49 mL/min/{1.73_m2} — ABNORMAL LOW (ref >90)
Glucose: 82 mg/dl (ref 70–140)
POTASSIUM: 3.9 meq/L (ref 3.5–5.1)
SODIUM: 140 meq/L (ref 136–145)
Total Bilirubin: 0.47 mg/dL (ref 0.20–1.20)
Total Protein: 6.9 g/dL (ref 6.4–8.3)

## 2015-06-15 MED ORDER — DENOSUMAB 60 MG/ML ~~LOC~~ SOLN
60.0000 mg | Freq: Once | SUBCUTANEOUS | Status: AC
  Administered 2015-06-15: 60 mg via SUBCUTANEOUS
  Filled 2015-06-15: qty 1

## 2015-06-15 NOTE — Patient Instructions (Signed)
Denosumab injection  What is this medicine?  DENOSUMAB (den oh sue mab) slows bone breakdown. Prolia is used to treat osteoporosis in women after menopause and in men. Xgeva is used to prevent bone fractures and other bone problems caused by cancer bone metastases. Xgeva is also used to treat giant cell tumor of the bone.  This medicine may be used for other purposes; ask your health care provider or pharmacist if you have questions.  What should I tell my health care provider before I take this medicine?  They need to know if you have any of these conditions:  -dental disease  -eczema  -infection or history of infections  -kidney disease or on dialysis  -low blood calcium or vitamin D  -malabsorption syndrome  -scheduled to have surgery or tooth extraction  -taking medicine that contains denosumab  -thyroid or parathyroid disease  -an unusual reaction to denosumab, other medicines, foods, dyes, or preservatives  -pregnant or trying to get pregnant  -breast-feeding  How should I use this medicine?  This medicine is for injection under the skin. It is given by a health care professional in a hospital or clinic setting.  If you are getting Prolia, a special MedGuide will be given to you by the pharmacist with each prescription and refill. Be sure to read this information carefully each time.  For Prolia, talk to your pediatrician regarding the use of this medicine in children. Special care may be needed. For Xgeva, talk to your pediatrician regarding the use of this medicine in children. While this drug may be prescribed for children as young as 13 years for selected conditions, precautions do apply.  Overdosage: If you think you have taken too much of this medicine contact a poison control center or emergency room at once.  NOTE: This medicine is only for you. Do not share this medicine with others.  What if I miss a dose?  It is important not to miss your dose. Call your doctor or health care professional if you are  unable to keep an appointment.  What may interact with this medicine?  Do not take this medicine with any of the following medications:  -other medicines containing denosumab  This medicine may also interact with the following medications:  -medicines that suppress the immune system  -medicines that treat cancer  -steroid medicines like prednisone or cortisone  This list may not describe all possible interactions. Give your health care provider a list of all the medicines, herbs, non-prescription drugs, or dietary supplements you use. Also tell them if you smoke, drink alcohol, or use illegal drugs. Some items may interact with your medicine.  What should I watch for while using this medicine?  Visit your doctor or health care professional for regular checks on your progress. Your doctor or health care professional may order blood tests and other tests to see how you are doing.  Call your doctor or health care professional if you get a cold or other infection while receiving this medicine. Do not treat yourself. This medicine may decrease your body's ability to fight infection.  You should make sure you get enough calcium and vitamin D while you are taking this medicine, unless your doctor tells you not to. Discuss the foods you eat and the vitamins you take with your health care professional.  See your dentist regularly. Brush and floss your teeth as directed. Before you have any dental work done, tell your dentist you are receiving this medicine.  Do   not become pregnant while taking this medicine or for 5 months after stopping it. Women should inform their doctor if they wish to become pregnant or think they might be pregnant. There is a potential for serious side effects to an unborn child. Talk to your health care professional or pharmacist for more information.  What side effects may I notice from receiving this medicine?  Side effects that you should report to your doctor or health care professional as soon as  possible:  -allergic reactions like skin rash, itching or hives, swelling of the face, lips, or tongue  -breathing problems  -chest pain  -fast, irregular heartbeat  -feeling faint or lightheaded, falls  -fever, chills, or any other sign of infection  -muscle spasms, tightening, or twitches  -numbness or tingling  -skin blisters or bumps, or is dry, peels, or red  -slow healing or unexplained pain in the mouth or jaw  -unusual bleeding or bruising  Side effects that usually do not require medical attention (Report these to your doctor or health care professional if they continue or are bothersome.):  -muscle pain  -stomach upset, gas  This list may not describe all possible side effects. Call your doctor for medical advice about side effects. You may report side effects to FDA at 1-800-FDA-1088.  Where should I keep my medicine?  This medicine is only given in a clinic, doctor's office, or other health care setting and will not be stored at home.  NOTE: This sheet is a summary. It may not cover all possible information. If you have questions about this medicine, talk to your doctor, pharmacist, or health care provider.      2016, [employer] Standard. (2011-06-19 12:37:47)

## 2015-07-13 ENCOUNTER — Other Ambulatory Visit: Payer: Self-pay | Admitting: Hematology and Oncology

## 2015-08-26 ENCOUNTER — Other Ambulatory Visit: Payer: Self-pay | Admitting: Hematology and Oncology

## 2015-10-13 DIAGNOSIS — I129 Hypertensive chronic kidney disease with stage 1 through stage 4 chronic kidney disease, or unspecified chronic kidney disease: Secondary | ICD-10-CM | POA: Diagnosis not present

## 2015-11-20 ENCOUNTER — Other Ambulatory Visit: Payer: Self-pay | Admitting: Hematology and Oncology

## 2015-12-07 ENCOUNTER — Ambulatory Visit: Payer: Medicare Other | Admitting: Hematology and Oncology

## 2015-12-07 ENCOUNTER — Other Ambulatory Visit: Payer: Self-pay

## 2015-12-07 ENCOUNTER — Ambulatory Visit: Payer: Medicare Other

## 2015-12-07 ENCOUNTER — Other Ambulatory Visit: Payer: Medicare Other

## 2015-12-07 DIAGNOSIS — D0512 Intraductal carcinoma in situ of left breast: Secondary | ICD-10-CM

## 2015-12-07 NOTE — Assessment & Plan Note (Signed)
DCIS left breast status post lumpectomy and radiation, could not tolerate tamoxifen because of superficial venous thrombosis (started 10/02/2012) currently on Arimidex since 10/02/2013.  Elevated ALT and AST and alkaline phosphatase: Ultrasound of the liver showed fatty liver. These abnormal blood test had normalized after stopping Aromasin and switching her to Arimidex. Her primary care physician is monitoring her liver.  Breast Cancer Surveillance: 1. Breast exam : 06/08/15 no concerning findings. 2. Mammogram 04/19/2015 No abnormalities. Postsurgical changes. Breast Density Category B. I recommended that she get 3-D mammograms for surveillance. Discussed the differences between different breast density categories. 3. Bone density: Osteoporosis T score at -2.5 On Prolia every 6 months started June 2017  Anastrozole toxicities: 1. Muscle aches and pains: Intermittently she stops anastrozole which seems to have helped.  She takes probiotics Patient tore hamstring muscle  Return to clinic in 1 year for follow-up.

## 2015-12-08 ENCOUNTER — Other Ambulatory Visit (HOSPITAL_BASED_OUTPATIENT_CLINIC_OR_DEPARTMENT_OTHER): Payer: Medicare Other

## 2015-12-08 ENCOUNTER — Encounter: Payer: Self-pay | Admitting: Hematology and Oncology

## 2015-12-08 ENCOUNTER — Ambulatory Visit (HOSPITAL_BASED_OUTPATIENT_CLINIC_OR_DEPARTMENT_OTHER): Payer: Medicare Other

## 2015-12-08 ENCOUNTER — Ambulatory Visit (HOSPITAL_BASED_OUTPATIENT_CLINIC_OR_DEPARTMENT_OTHER): Payer: Medicare Other | Admitting: Hematology and Oncology

## 2015-12-08 VITALS — BP 132/57 | HR 70 | Temp 98.1°F | Resp 18 | Wt 178.4 lb

## 2015-12-08 DIAGNOSIS — Z803 Family history of malignant neoplasm of breast: Secondary | ICD-10-CM

## 2015-12-08 DIAGNOSIS — D0512 Intraductal carcinoma in situ of left breast: Secondary | ICD-10-CM | POA: Diagnosis not present

## 2015-12-08 DIAGNOSIS — M810 Age-related osteoporosis without current pathological fracture: Secondary | ICD-10-CM

## 2015-12-08 LAB — COMPREHENSIVE METABOLIC PANEL
ALBUMIN: 3.5 g/dL (ref 3.5–5.0)
ALK PHOS: 65 U/L (ref 40–150)
ALT: 15 U/L (ref 0–55)
AST: 14 U/L (ref 5–34)
Anion Gap: 10 mEq/L (ref 3–11)
BILIRUBIN TOTAL: 0.55 mg/dL (ref 0.20–1.20)
BUN: 18.3 mg/dL (ref 7.0–26.0)
CO2: 26 mEq/L (ref 22–29)
Calcium: 9.5 mg/dL (ref 8.4–10.4)
Chloride: 105 mEq/L (ref 98–109)
Creatinine: 1 mg/dL (ref 0.6–1.1)
EGFR: 56 mL/min/{1.73_m2} — ABNORMAL LOW (ref >90)
GLUCOSE: 89 mg/dL (ref 70–140)
Potassium: 3.8 mEq/L (ref 3.5–5.1)
SODIUM: 140 meq/L (ref 136–145)
TOTAL PROTEIN: 6.8 g/dL (ref 6.4–8.3)

## 2015-12-08 LAB — CBC WITH DIFFERENTIAL/PLATELET
BASO%: 0.4 % (ref 0.0–2.0)
Basophils Absolute: 0 10*3/uL (ref 0.0–0.1)
EOS ABS: 0.1 10*3/uL (ref 0.0–0.5)
EOS%: 1.7 % (ref 0.0–7.0)
HCT: 40.3 % (ref 34.8–46.6)
HEMOGLOBIN: 13.5 g/dL (ref 11.6–15.9)
LYMPH%: 38.8 % (ref 14.0–49.7)
MCH: 29.7 pg (ref 25.1–34.0)
MCHC: 33.5 g/dL (ref 31.5–36.0)
MCV: 88.8 fL (ref 79.5–101.0)
MONO#: 0.7 10*3/uL (ref 0.1–0.9)
MONO%: 10.2 % (ref 0.0–14.0)
NEUT%: 48.9 % (ref 38.4–76.8)
NEUTROS ABS: 3.6 10*3/uL (ref 1.5–6.5)
Platelets: 199 10*3/uL (ref 145–400)
RBC: 4.54 10*6/uL (ref 3.70–5.45)
RDW: 13.4 % (ref 11.2–14.5)
WBC: 7.3 10*3/uL (ref 3.9–10.3)
lymph#: 2.8 10*3/uL (ref 0.9–3.3)

## 2015-12-08 MED ORDER — DENOSUMAB 60 MG/ML ~~LOC~~ SOLN
60.0000 mg | Freq: Once | SUBCUTANEOUS | Status: AC
  Administered 2015-12-08: 60 mg via SUBCUTANEOUS
  Filled 2015-12-08: qty 1

## 2015-12-08 NOTE — Patient Instructions (Signed)
Denosumab injection  What is this medicine?  DENOSUMAB (den oh sue mab) slows bone breakdown. Prolia is used to treat osteoporosis in women after menopause and in men. Xgeva is used to prevent bone fractures and other bone problems caused by cancer bone metastases. Xgeva is also used to treat giant cell tumor of the bone.  This medicine may be used for other purposes; ask your health care provider or pharmacist if you have questions.  What should I tell my health care provider before I take this medicine?  They need to know if you have any of these conditions:  -dental disease  -eczema  -infection or history of infections  -kidney disease or on dialysis  -low blood calcium or vitamin D  -malabsorption syndrome  -scheduled to have surgery or tooth extraction  -taking medicine that contains denosumab  -thyroid or parathyroid disease  -an unusual reaction to denosumab, other medicines, foods, dyes, or preservatives  -pregnant or trying to get pregnant  -breast-feeding  How should I use this medicine?  This medicine is for injection under the skin. It is given by a health care professional in a hospital or clinic setting.  If you are getting Prolia, a special MedGuide will be given to you by the pharmacist with each prescription and refill. Be sure to read this information carefully each time.  For Prolia, talk to your pediatrician regarding the use of this medicine in children. Special care may be needed. For Xgeva, talk to your pediatrician regarding the use of this medicine in children. While this drug may be prescribed for children as young as 13 years for selected conditions, precautions do apply.  Overdosage: If you think you have taken too much of this medicine contact a poison control center or emergency room at once.  NOTE: This medicine is only for you. Do not share this medicine with others.  What if I miss a dose?  It is important not to miss your dose. Call your doctor or health care professional if you are  unable to keep an appointment.  What may interact with this medicine?  Do not take this medicine with any of the following medications:  -other medicines containing denosumab  This medicine may also interact with the following medications:  -medicines that suppress the immune system  -medicines that treat cancer  -steroid medicines like prednisone or cortisone  This list may not describe all possible interactions. Give your health care provider a list of all the medicines, herbs, non-prescription drugs, or dietary supplements you use. Also tell them if you smoke, drink alcohol, or use illegal drugs. Some items may interact with your medicine.  What should I watch for while using this medicine?  Visit your doctor or health care professional for regular checks on your progress. Your doctor or health care professional may order blood tests and other tests to see how you are doing.  Call your doctor or health care professional if you get a cold or other infection while receiving this medicine. Do not treat yourself. This medicine may decrease your body's ability to fight infection.  You should make sure you get enough calcium and vitamin D while you are taking this medicine, unless your doctor tells you not to. Discuss the foods you eat and the vitamins you take with your health care professional.  See your dentist regularly. Brush and floss your teeth as directed. Before you have any dental work done, tell your dentist you are receiving this medicine.  Do   not become pregnant while taking this medicine or for 5 months after stopping it. Women should inform their doctor if they wish to become pregnant or think they might be pregnant. There is a potential for serious side effects to an unborn child. Talk to your health care professional or pharmacist for more information.  What side effects may I notice from receiving this medicine?  Side effects that you should report to your doctor or health care professional as soon as  possible:  -allergic reactions like skin rash, itching or hives, swelling of the face, lips, or tongue  -breathing problems  -chest pain  -fast, irregular heartbeat  -feeling faint or lightheaded, falls  -fever, chills, or any other sign of infection  -muscle spasms, tightening, or twitches  -numbness or tingling  -skin blisters or bumps, or is dry, peels, or red  -slow healing or unexplained pain in the mouth or jaw  -unusual bleeding or bruising  Side effects that usually do not require medical attention (Report these to your doctor or health care professional if they continue or are bothersome.):  -muscle pain  -stomach upset, gas  This list may not describe all possible side effects. Call your doctor for medical advice about side effects. You may report side effects to FDA at 1-800-FDA-1088.  Where should I keep my medicine?  This medicine is only given in a clinic, doctor's office, or other health care setting and will not be stored at home.  NOTE: This sheet is a summary. It may not cover all possible information. If you have questions about this medicine, talk to your doctor, pharmacist, or health care provider.      2016, [employer] Standard. (2011-06-19 12:37:47)

## 2015-12-08 NOTE — Progress Notes (Signed)
Patient Care Team: Lajean Manes, MD as PCP - General (Internal Medicine)  DIAGNOSIS:  Encounter Diagnosis  Name Primary?  . Neoplasm of left breast, primary tumor staging category Tis: ductal carcinoma in situ (DCIS)     SUMMARY OF ONCOLOGIC HISTORY:   Neoplasm of left breast, primary tumor staging category Tis: ductal carcinoma in situ (DCIS)   06/24/2012 Surgery    Left breast lumpectomy and sentinel lymph node biopsy DCIS      08/30/2012 - 09/28/2012 Radiation Therapy    Radiation to lumpectomy site      10/02/2012 - 10/02/2013 Anti-estrogen oral therapy    Tamoxifen 20 mg daily started 10/02/2012 discontinued due to superficial leg vein thrombosis. Aromasin 25 mg started July 2015 stopped for muscle aches, restarted 08/28/2013 stopped in 10/02/13 due to elevation AST adn ALT      10/02/2013 -  Anti-estrogen oral therapy    Arimidex 1 mg daily       CHIEF COMPLIANT: Follow-up on Arimidex therapy  INTERVAL HISTORY: Laurie Gomez is a 75 year old with above-mentioned history of left breast cancer treated with lumpectomy followed by radiation and is currently on antiestrogen therapy. She has been on multiple other treatments including tamoxifen and Aromasin. She is currently on Arimidex and appears to be tolerating it well. She has taken this treatment for the past 3 years. She has osteoporosis and has been receiving Prolia injections.  REVIEW OF SYSTEMS:   Constitutional: Denies fevers, chills or abnormal weight loss Eyes: Denies blurriness of vision Ears, nose, mouth, throat, and face: Denies mucositis or sore throat Respiratory: Denies cough, dyspnea or wheezes Cardiovascular: Denies palpitation, chest discomfort Gastrointestinal:  Denies nausea, heartburn or change in bowel habits Skin: Denies abnormal skin rashes Lymphatics: Denies new lymphadenopathy or easy bruising Neurological:Denies numbness, tingling or new weaknesses Behavioral/Psych: Mood is stable, no new  changes  Extremities: No lower extremity edema Breast:  denies any pain or lumps or nodules in either breasts All other systems were reviewed with the patient and are negative.  I have reviewed the past medical history, past surgical history, social history and family history with the patient and they are unchanged from previous note.  ALLERGIES:  has No Known Allergies.  MEDICATIONS:  Current Outpatient Prescriptions  Medication Sig Dispense Refill  . amlodipine-benazepril (LOTREL) 2.5-10 MG per capsule   3  . anastrozole (ARIMIDEX) 1 MG tablet TAKE 1 TABLET BY MOUTH ONCE DAILY 90 tablet 0  . anastrozole (ARIMIDEX) 1 MG tablet TAKE 1 TABLET BY MOUTH ONCE DAILY 90 tablet 0  . Calcium Carbonate-Vitamin D 600-125 MG-UNIT TABS Take 1 tablet by mouth daily.     Marland Kitchen levothyroxine (SYNTHROID) 75 MCG tablet Take 75 mcg by mouth daily before breakfast.     . Multiple Vitamin (MULTIVITAMIN) tablet Take 1 tablet by mouth daily.     . Psyllium (WAL-MUCIL PO) Take 2 tablets by mouth daily.    Marland Kitchen saccharomyces boulardii (FLORASTOR) 250 MG capsule Take 1 capsule (250 mg total) by mouth 2 (two) times daily.    Marland Kitchen triamterene-hydrochlorothiazide (MAXZIDE) 75-50 MG per tablet Take 1 tablet by mouth daily. Take 1/2 tablet daily.    . vitamin B-12 (CYANOCOBALAMIN) 50 MCG tablet Take 2 tablets by mouth daily.      No current facility-administered medications for this visit.     PHYSICAL EXAMINATION: ECOG PERFORMANCE STATUS: 1 - Symptomatic but completely ambulatory  Vitals:   12/08/15 0826  BP: (!) 132/57  Pulse: 70  Resp: 18  Temp: 98.1 F (36.7 C)   Filed Weights   12/08/15 0826  Weight: 178 lb 6.4 oz (80.9 kg)    GENERAL:alert, no distress and comfortable SKIN: skin color, texture, turgor are normal, no rashes or significant lesions EYES: normal, Conjunctiva are pink and non-injected, sclera clear OROPHARYNX:no exudate, no erythema and lips, buccal mucosa, and tongue normal  NECK: supple,  thyroid normal size, non-tender, without nodularity LYMPH:  no palpable lymphadenopathy in the cervical, axillary or inguinal LUNGS: clear to auscultation and percussion with normal breathing effort HEART: regular rate & rhythm and no murmurs and no lower extremity edema ABDOMEN:abdomen soft, non-tender and normal bowel sounds MUSCULOSKELETAL:no cyanosis of digits and no clubbing  NEURO: alert & oriented x 3 with fluent speech, no focal motor/sensory deficits EXTREMITIES: No lower extremity edema  LABORATORY DATA:  I have reviewed the data as listed   Chemistry      Component Value Date/Time   NA 140 06/15/2015 0815   K 3.9 06/15/2015 0815   CL 107 12/26/2013 0435   CO2 25 06/15/2015 0815   BUN 18.0 06/15/2015 0815   CREATININE 1.1 06/15/2015 0815      Component Value Date/Time   CALCIUM 9.5 06/15/2015 0815   ALKPHOS 76 06/15/2015 0815   AST 15 06/15/2015 0815   ALT 20 06/15/2015 0815   BILITOT 0.47 06/15/2015 0815       Lab Results  Component Value Date   WBC 7.3 12/08/2015   HGB 13.5 12/08/2015   HCT 40.3 12/08/2015   MCV 88.8 12/08/2015   PLT 199 12/08/2015   NEUTROABS 3.6 12/08/2015    ASSESSMENT & PLAN:  Neoplasm of left breast, primary tumor staging category Tis: ductal carcinoma in situ (DCIS) DCIS left breast status post lumpectomy and radiation, could not tolerate tamoxifen because of superficial venous thrombosis (started 10/02/2012) currently on Arimidex since 10/02/2013.  Elevated ALT and AST and alkaline phosphatase: Ultrasound of the liver showed fatty liver. These abnormal blood test had normalized after stopping Aromasin and switching her to Arimidex. Her primary care physician is monitoring her liver.  Breast Cancer Surveillance: 1. Breast exam : 06/08/15 no concerning findings. 2. Mammogram 04/19/2015 No abnormalities. Postsurgical changes. Breast Density Category B. I recommended that she get 3-D mammograms for surveillance. Discussed the  differences between different breast density categories. 3. Bone density: Osteoporosis T score at -2.5 On Prolia every 6 months started June 2017  Anastrozole toxicities: 1. Muscle aches and pains: Intermittently she stops anastrozole which seems to have helped. Patient has 2 sisters with breast cancer. I recommended that she get genetic testing. She tells me that one of her sisters has tried to get genetic testing done but was informed that it would not be covered. I tried to reassure her that the genetic counselors will be able to assist her. I believe that she does qualify for genetic testing based on her family history.  She takes probiotics I reviewed her CBC from today. Return to clinic in 1 year for follow-upevery 6 months for Prolia injections with labs  .    Orders Placed This Encounter  Procedures  . Ambulatory referral to Genetics    Referral Priority:   Routine    Referral Type:   Consultation    Referral Reason:   Specialty Services Required    Number of Visits Requested:   1   The patient has a good understanding of the overall plan. she agrees with it. she will call with any problems that  may develop before the next visit here.   Rulon Eisenmenger, MD 12/08/15

## 2015-12-09 ENCOUNTER — Other Ambulatory Visit: Payer: Medicare Other

## 2015-12-09 ENCOUNTER — Ambulatory Visit (HOSPITAL_BASED_OUTPATIENT_CLINIC_OR_DEPARTMENT_OTHER): Payer: Medicare Other | Admitting: Genetic Counselor

## 2015-12-09 DIAGNOSIS — Z315 Encounter for genetic counseling: Secondary | ICD-10-CM

## 2015-12-09 DIAGNOSIS — Z803 Family history of malignant neoplasm of breast: Secondary | ICD-10-CM

## 2015-12-09 DIAGNOSIS — Z853 Personal history of malignant neoplasm of breast: Secondary | ICD-10-CM

## 2015-12-09 DIAGNOSIS — Z808 Family history of malignant neoplasm of other organs or systems: Secondary | ICD-10-CM | POA: Diagnosis not present

## 2015-12-09 DIAGNOSIS — D0512 Intraductal carcinoma in situ of left breast: Secondary | ICD-10-CM

## 2015-12-10 ENCOUNTER — Encounter: Payer: Self-pay | Admitting: Genetic Counselor

## 2015-12-10 DIAGNOSIS — Z803 Family history of malignant neoplasm of breast: Secondary | ICD-10-CM | POA: Insufficient documentation

## 2015-12-10 NOTE — Progress Notes (Signed)
REFERRING PROVIDER: Nicholas Lose, MD  PRIMARY PROVIDER:  Mathews Argyle, MD  PRIMARY REASON FOR VISIT:  1. History of breast cancer in female   2. Family history of breast cancer in female      HISTORY OF PRESENT ILLNESS:   Laurie Gomez, a 75 y.o. female, was seen for a [facility] cancer genetics consultation at the request of Dr. Lindi Gomez due to a personal and family history of breast cancer.  Laurie Gomez presents to clinic today to discuss the possibility of a hereditary predisposition to cancer, genetic testing, and to further clarify her future cancer risks, as well as potential cancer risks for family members.   In May 2014, at the age of [age], Laurie Gomez was diagnosed with DCIS of the left breast.  Hormone receptor status was ER+, PR-. This was treated with left lumpectomy and radiation.    CANCER HISTORY:    Neoplasm of left breast, primary tumor staging category Tis: ductal carcinoma in situ (DCIS)   06/24/2012 Surgery    Left breast lumpectomy and sentinel lymph node biopsy DCIS      08/30/2012 - 09/28/2012 Radiation Therapy    Radiation to lumpectomy site      10/02/2012 - 10/02/2013 Anti-estrogen oral therapy    Tamoxifen 20 mg daily started 10/02/2012 discontinued due to superficial leg vein thrombosis. Aromasin 25 mg started July 2015 stopped for muscle aches, restarted 08/28/2013 stopped in 10/02/13 due to elevation AST adn ALT      10/02/2013 -  Anti-estrogen oral therapy    Arimidex 1 mg daily        HORMONAL RISK FACTORS:  Menarche was at age 25.  First live birth at age 45.  OCP use for approximately less than 1 year.  Ovaries intact: yes.  Hysterectomy: no.  Menopausal status: postmenopausal.  HRT use: 1 years, remotely. Colonoscopy: most recent was in either 2014-2015 and a polyp was found, per patient report; she had one prior colonoscopy - she reports maybe 2-3 polyps total; due for next colonoscopy in 2018. Mammogram within the last year: yes. Number  of breast biopsies: 1. Up to date with pelvic exams:  yes. Any excessive radiation exposure/other exposures in the past:  Reports some history of secondhand smoke exposure (mother smoked, but rarely)  Past Medical History:  Diagnosis Date  . Breast cancer ([place])   . Colon polyps   . Edema   . Hemorrhoids   . Hypertension   . Osteoporosis   . superifical thrombosis     Past Surgical History:  Procedure Laterality Date  . BREAST LUMPECTOMY  2014  . [place]  . THYROIDECTOMY, PARTIAL  2000  . TUBAL LIGATION  1975    Social History   Social History  . Marital status: Married    Spouse name: N/A  . Number of children: N/A  . Years of education: N/A   Social History Main Topics  . Smoking status: Never Smoker  . Smokeless tobacco: Never Used  . Alcohol use Yes     Comment: rare - maybe 1 glass of wine per month (12/2015)  . Drug use: No  . Sexual activity: Yes   Other Topics Concern  . None   Social History Narrative  . None     FAMILY HISTORY:  We obtained a detailed, 4-generation family history.  Significant diagnoses are listed below: Family History  Problem Relation Age of Onset  . Breast cancer Sister  dx 71-72  . Breast cancer Cousin     maternal 1st cousin dx 85s  . Congestive Heart Failure Father 26  . Breast cancer Sister 3  . Skin cancer Sister     maybe had a skin cancer previously  . Multiple sclerosis Maternal Aunt   . Heart Problems Maternal Uncle   . Congestive Heart Failure Paternal Grandmother     d. late 40s  . Congestive Heart Failure Paternal Grandfather     d. 78s  . Other Other     niece w/ hx of multiple benign lumpectomies, mid to late 17s  . Breast cancer Cousin     maternal 1st cousin dx 36s  . Other Cousin     paternal 1st cousin w/ hx of benign breast lump    Laurie Gomez has two daughters, ages 32 and 30, and they have never had cancer.  Her oldest daughter has one son and  one daughter of her own.  The youngest daughter has no children.  Laurie Gomez has two full sisters, ages 71 and 81.  Both of her sisters have had cancer.  One sister was diagnosed at age 50-72; the other sister was diagnosed at age 21.  One of Laurie Gomez's nieces has a history of multiple benign breast lumpectomies.    Laurie Gomez mother passed away at 57 and she never had cancer.  She had two full sisters and two full brothers, all of whom passed away at later ages and never had cancer.  One aunt had three daughters and one son, and two of her daughters were diagnosed with breast cancer in their 63s.  Laurie Gomez is unaware of any additional history of cancer in other maternal first cousins.  Her maternal grandmother passed away in her 41s and her grandfather passed in his 80s--neither of them had cancer.  Laurie Gomez has no information for her maternal great aunts/uncles and great grandparents.    Laurie Gomez father had one full sister and one full brother.  His sister passed away at 69 and his brother passed in his late 67s.  Neither of these relatives had cancer (other than possible skin cancers), to Laurie Gomez's knowledge.  One of Laurie Gomez's paternal first cousins has a history of a benign breast lumpectomy.  Laurie Gomez paternal grandmother died of congestive heart failure in her late 79s.  Her grandfather died of congestive heart failure in his 6s.  She is unaware of any history of cancer for these grandparents.  She has no information for her paternal great aunts/uncles and great grandparents.  Laurie Gomez is unaware of any previous family history of genetic testing for hereditary cancer risks.  Patient's maternal ancestors are of [place] descent, and paternal ancestors are of [place] descent. There is no reported Ashkenazi Jewish ancestry. There is no known consanguinity.  GENETIC COUNSELING ASSESSMENT: Laurie Gomez is a 75 y.o. female with a personal and family history of breast  cancer which is somewhat suggestive of a hereditary breast cancer sydnrome and predisposition to cancer. We, therefore, discussed and recommended the following at today's visit.   DISCUSSION: We reviewed the characteristics, features and inheritance patterns of hereditary cancer syndromes. We also discussed genetic testing, including the appropriate family members to test, the process of testing, insurance coverage and turn-around-time for results. We discussed the implications of a negative, positive and/or variant of uncertain significant result. We recommended Laurie Gomez pursue genetic testing for the 20-gene Breast/Ovarian Cancer  Panel through [facility].  The Breast/Ovarian Cancer Panel offered by [facility] ([place], [place]) includes sequencing and deletion/duplication analysis for the following 19 genes:  ATM, BARD1, BRCA1, BRCA2, BRIP1, CDH1, CHEK2, FANCC, MLH1, MSH2, MSH6, NBN, PALB2, PMS2, PTEN, RAD51C, RAD51D, TP53, and XRCC2.  This panel also includes deletion/duplication analysis (without sequencing) for one gene, EPCAM.   Based on Laurie Gomez's personal and family history of cancer, she meets medical criteria for genetic testing. Despite that she meets criteria, she may still have an out of pocket cost. We discussed that if her out of pocket cost for testing is over $100, the laboratory will call and confirm whether she wants to proceed with testing.  If the out of pocket cost of testing is less than $100 she will be billed by the genetic testing laboratory.   PLAN: After considering the risks, benefits, and limitations, Laurie Gomez  provided informed consent to pursue genetic testing and the blood sample was sent to [facility] for analysis of the 20-gene Breast/Ovarian Cancer Panel. Results should be available within approximately 2-3 weeks' time, at which point they will be disclosed by telephone to Ms. Kearn, as will any additional recommendations warranted by these  results. Ms. Heide will receive a summary of her genetic counseling visit and a copy of her results once available. This information will also be available in Epic. We encouraged Ms. Danh to remain in contact with cancer genetics annually so that we can continuously update the family history and inform her of any changes in cancer genetics and testing that may be of benefit for her family. Ms. Gully's questions were answered to her satisfaction today. Our contact information was provided should additional questions or concerns arise.  Thank you for the referral and allowing us to share in the care of your patient.   Kayla Boggs, MS, CGC Certified Genetic Counselor [email or web address] Phone: [phone]  The patient was seen for a total of 45 minutes in face-to-face genetic counseling.  This patient was discussed with Drs. Magrinat, Gudena and/or Feng who agrees with the above.    _______________________________________________________________________ For Office Staff:  Number of people involved in session: 1 Was an Intern/ student involved with case: no   

## 2015-12-17 DIAGNOSIS — J0100 Acute maxillary sinusitis, unspecified: Secondary | ICD-10-CM | POA: Diagnosis not present

## 2015-12-23 ENCOUNTER — Telehealth: Payer: Self-pay | Admitting: Genetic Counselor

## 2015-12-23 NOTE — Telephone Encounter (Signed)
Discussed with Ms. Biehn that her genetic test results were negative for mutations within any of 20 genes on the Breast/Ovarian Cancer Panel through [facility].  Additionally, no variants of uncertain significance (VUSes) were found.  Discussed that most likely this is a reassuring result, but that there is always a chance that we could be missing something on our current testing.  Recommended her sisters and cousins who have had breast cancer have genetic testing.  She plans to discuss this with them.  She would like a copy of her results emailed to her and I am happy to do that.

## 2015-12-24 ENCOUNTER — Ambulatory Visit: Payer: Self-pay | Admitting: Genetic Counselor

## 2015-12-24 DIAGNOSIS — Z1379 Encounter for other screening for genetic and chromosomal anomalies: Secondary | ICD-10-CM

## 2015-12-24 DIAGNOSIS — Z853 Personal history of malignant neoplasm of breast: Secondary | ICD-10-CM

## 2015-12-24 DIAGNOSIS — Z803 Family history of malignant neoplasm of breast: Secondary | ICD-10-CM

## 2015-12-29 DIAGNOSIS — Z1379 Encounter for other screening for genetic and chromosomal anomalies: Secondary | ICD-10-CM | POA: Insufficient documentation

## 2015-12-29 NOTE — Progress Notes (Signed)
GENETIC TEST RESULT  HPI: Ms. Hogate was previously seen in the [facility] clinic due to a personal and family history of breast cancer and concerns regarding a hereditary predisposition to cancer. Please refer to our prior cancer genetics clinic note from December 09, 2015 for more information regarding Ms. Westenberger's medical, social and family histories, and our assessment and recommendations, at the time. Ms. Schlichting recent genetic test results were disclosed to her, as were recommendations warranted by these results. These results and recommendations are discussed in more detail below.  GENETIC TEST RESULTS: Genetic testing reported out on December 21, 2015 through the [facility] found no deleterious mutations.  Additionally, no variant of uncertain significance (VUSes) were found.  The Breast/Ovarian Cancer Panel offered by [facility] [place], [place]) includes sequencing and deletion/duplication analysis for the following 19 genes:  ATM, BARD1, BRCA1, BRCA2, BRIP1, CDH1, CHEK2, FANCC, MLH1, MSH2, MSH6, NBN, PALB2, PMS2, PTEN, RAD51C, RAD51D, TP53, and XRCC2.  This panel also includes deletion/duplication analysis (without sequencing) for one gene, EPCAM.  The test report will be scanned into EPIC and will be located under the Molecular Pathology section of the Results Review tab.   We discussed with Ms. Umble that since the current genetic testing is not perfect, it is possible there may be a gene mutation in one of these genes that current testing cannot detect, but that chance is small. We also discussed, that it is possible that another gene that has not yet been discovered, or that we have not yet tested, is responsible for the cancer diagnoses in the family, and it is, therefore, important to remain in touch with cancer genetics in the future so that we can continue to offer Ms. Carrender the most up-to-date genetic testing.     CANCER SCREENING  RECOMMENDATIONS: We still do not have an explanation for the personal and family history of breast cancer. This result may be reassuring and indicate that Ms. Deleeuw likely does not have an increased risk for a future cancer due to a mutation in one of these genes. Most cancer happens by chance.  However, this result may simply reflect our current inability to detect all mutations within these genes or there may be a different gene that has not yet been discovered or tested.  Further testing of other of Ms. 41 sisters and maternal first cousins who have also had breast cancer may be helpful to further understanding of the personal and familial cancer risks.  In the meantime, we recommended that Ms. Pallo continue to follow her doctors' recommendations for future cancer screening.  RECOMMENDATIONS FOR FAMILY MEMBERS: Women in this family might be at some increased risk of developing cancer, over the general population risk, simply due to the family history of cancer. We recommended women in this family have a yearly mammogram beginning at age 69, or 46 years younger than the earliest onset of cancer, an annual clinical breast exam, and perform monthly breast self-exams. Women in this family should also have a gynecological exam as recommended by their primary provider. All family members should have a colonoscopy by age 36.  Based on Ms. Zehren's family history, we recommended her two sisters and her two maternal first cousins, all of whom have a history of breast cancer have genetic counseling and testing. Ms. Steig will let us know if we can be of any assistance in coordinating genetic counseling and/or testing for these family members.   FOLLOW-UP: Lastly, we discussed with  Ms. Dieu that cancer genetics is a rapidly advancing field and it is possible that new genetic tests will be appropriate for her and/or her family members in the future. We encouraged her to remain in contact with cancer genetics on  an annual basis so we can update her personal and family histories and let her know of advances in cancer genetics that may benefit this family.   Our contact number was provided. Ms. Cermak questions were answered to her satisfaction, and she knows she is welcome to call us at anytime with additional questions or concerns.   Jeanine Luz, MS, [facility] Certified Genetic Counselor [email or web address] Phone: [phone]

## 2016-02-21 ENCOUNTER — Other Ambulatory Visit: Payer: Self-pay | Admitting: Hematology and Oncology

## 2016-02-22 ENCOUNTER — Other Ambulatory Visit: Payer: Self-pay

## 2016-02-25 ENCOUNTER — Telehealth: Payer: Self-pay

## 2016-02-25 MED ORDER — ANASTROZOLE 1 MG PO TABS: 1.0000 mg | ORAL_TABLET | Freq: Every day | ORAL | 0 refills | Status: DC

## 2016-02-25 NOTE — Telephone Encounter (Signed)
Pt called for refill of anastrozole. Done per protocol

## 2016-03-10 ENCOUNTER — Other Ambulatory Visit: Payer: Self-pay | Admitting: Hematology and Oncology

## 2016-03-10 DIAGNOSIS — Z853 Personal history of malignant neoplasm of breast: Secondary | ICD-10-CM

## 2016-04-17 ENCOUNTER — Other Ambulatory Visit: Payer: Self-pay | Admitting: Geriatric Medicine

## 2016-04-17 ENCOUNTER — Ambulatory Visit
Admission: RE | Admit: 2016-04-17 | Discharge: 2016-04-17 | Disposition: A | Discharge status: Home / Self Care | Payer: Medicare Other | Source: Ambulatory Visit | Attending: Geriatric Medicine | Admitting: Geriatric Medicine

## 2016-04-17 DIAGNOSIS — R0789 Other chest pain: Secondary | ICD-10-CM

## 2016-04-17 DIAGNOSIS — R0781 Pleurodynia: Secondary | ICD-10-CM | POA: Diagnosis not present

## 2016-04-17 DIAGNOSIS — S299XXA Unspecified injury of thorax, initial encounter: Secondary | ICD-10-CM | POA: Diagnosis not present

## 2016-04-19 ENCOUNTER — Ambulatory Visit
Admission: RE | Admit: 2016-04-19 | Discharge: 2016-04-19 | Disposition: A | Discharge status: Home / Self Care | Payer: Medicare Other | Source: Ambulatory Visit | Attending: Hematology and Oncology | Admitting: Hematology and Oncology

## 2016-04-19 DIAGNOSIS — R928 Other abnormal and inconclusive findings on diagnostic imaging of breast: Secondary | ICD-10-CM | POA: Diagnosis not present

## 2016-04-19 DIAGNOSIS — Z853 Personal history of malignant neoplasm of breast: Secondary | ICD-10-CM

## 2016-04-26 DIAGNOSIS — E039 Hypothyroidism, unspecified: Secondary | ICD-10-CM | POA: Diagnosis not present

## 2016-04-26 DIAGNOSIS — R030 Elevated blood-pressure reading, without diagnosis of hypertension: Secondary | ICD-10-CM | POA: Diagnosis not present

## 2016-04-26 DIAGNOSIS — I10 Essential (primary) hypertension: Secondary | ICD-10-CM | POA: Diagnosis not present

## 2016-04-26 DIAGNOSIS — Z6831 Body mass index (BMI) 31.0-31.9, adult: Secondary | ICD-10-CM | POA: Diagnosis not present

## 2016-04-26 DIAGNOSIS — Z0000 Encounter for general adult medical examination without abnormal findings: Secondary | ICD-10-CM | POA: Diagnosis not present

## 2016-04-26 DIAGNOSIS — N183 Chronic kidney disease, stage 3 (moderate): Secondary | ICD-10-CM | POA: Diagnosis not present

## 2016-04-26 DIAGNOSIS — Z1389 Encounter for screening for other disorder: Secondary | ICD-10-CM | POA: Diagnosis not present

## 2016-04-26 DIAGNOSIS — I129 Hypertensive chronic kidney disease with stage 1 through stage 4 chronic kidney disease, or unspecified chronic kidney disease: Secondary | ICD-10-CM | POA: Diagnosis not present

## 2016-04-26 DIAGNOSIS — E669 Obesity, unspecified: Secondary | ICD-10-CM | POA: Diagnosis not present

## 2016-04-26 DIAGNOSIS — Z79899 Other long term (current) drug therapy: Secondary | ICD-10-CM | POA: Diagnosis not present

## 2016-05-17 DIAGNOSIS — D0512 Intraductal carcinoma in situ of left breast: Secondary | ICD-10-CM | POA: Diagnosis not present

## 2016-05-17 DIAGNOSIS — E039 Hypothyroidism, unspecified: Secondary | ICD-10-CM | POA: Diagnosis not present

## 2016-05-17 DIAGNOSIS — I129 Hypertensive chronic kidney disease with stage 1 through stage 4 chronic kidney disease, or unspecified chronic kidney disease: Secondary | ICD-10-CM | POA: Diagnosis not present

## 2016-05-17 DIAGNOSIS — N183 Chronic kidney disease, stage 3 (moderate): Secondary | ICD-10-CM | POA: Diagnosis not present

## 2016-05-30 DIAGNOSIS — D235 Other benign neoplasm of skin of trunk: Secondary | ICD-10-CM | POA: Diagnosis not present

## 2016-05-30 DIAGNOSIS — L821 Other seborrheic keratosis: Secondary | ICD-10-CM | POA: Diagnosis not present

## 2016-05-30 DIAGNOSIS — D225 Melanocytic nevi of trunk: Secondary | ICD-10-CM | POA: Diagnosis not present

## 2016-05-30 DIAGNOSIS — D2261 Melanocytic nevi of right upper limb, including shoulder: Secondary | ICD-10-CM | POA: Diagnosis not present

## 2016-05-30 DIAGNOSIS — D2272 Melanocytic nevi of left lower limb, including hip: Secondary | ICD-10-CM | POA: Diagnosis not present

## 2016-05-30 DIAGNOSIS — D485 Neoplasm of uncertain behavior of skin: Secondary | ICD-10-CM | POA: Diagnosis not present

## 2016-05-30 DIAGNOSIS — L918 Other hypertrophic disorders of the skin: Secondary | ICD-10-CM | POA: Diagnosis not present

## 2016-05-30 DIAGNOSIS — D1801 Hemangioma of skin and subcutaneous tissue: Secondary | ICD-10-CM | POA: Diagnosis not present

## 2016-05-30 DIAGNOSIS — D2262 Melanocytic nevi of left upper limb, including shoulder: Secondary | ICD-10-CM | POA: Diagnosis not present

## 2016-05-30 DIAGNOSIS — D2271 Melanocytic nevi of right lower limb, including hip: Secondary | ICD-10-CM | POA: Diagnosis not present

## 2016-06-07 ENCOUNTER — Other Ambulatory Visit: Payer: Self-pay

## 2016-06-07 DIAGNOSIS — D0512 Intraductal carcinoma in situ of left breast: Secondary | ICD-10-CM

## 2016-06-08 ENCOUNTER — Ambulatory Visit: Payer: Medicare Other

## 2016-06-08 ENCOUNTER — Other Ambulatory Visit: Payer: Medicare Other

## 2016-06-09 ENCOUNTER — Telehealth: Payer: Self-pay | Admitting: Hematology and Oncology

## 2016-06-09 ENCOUNTER — Other Ambulatory Visit: Payer: Self-pay | Admitting: Hematology and Oncology

## 2016-06-09 MED ORDER — ANASTROZOLE 1 MG PO TABS: 1.0000 mg | ORAL_TABLET | Freq: Every day | ORAL | 2 refills | Status: DC

## 2016-06-09 NOTE — Telephone Encounter (Signed)
R/s appt per patient - patient called to r/s a missed appt on 6/7 to 6/11 - patient is aware of time.

## 2016-06-12 ENCOUNTER — Ambulatory Visit (HOSPITAL_BASED_OUTPATIENT_CLINIC_OR_DEPARTMENT_OTHER): Payer: Medicare Other

## 2016-06-12 ENCOUNTER — Other Ambulatory Visit (HOSPITAL_BASED_OUTPATIENT_CLINIC_OR_DEPARTMENT_OTHER): Payer: Medicare Other

## 2016-06-12 VITALS — BP 158/68 | HR 69 | Temp 97.4°F | Resp 18

## 2016-06-12 DIAGNOSIS — M810 Age-related osteoporosis without current pathological fracture: Secondary | ICD-10-CM

## 2016-06-12 DIAGNOSIS — D0512 Intraductal carcinoma in situ of left breast: Secondary | ICD-10-CM

## 2016-06-12 LAB — COMPREHENSIVE METABOLIC PANEL
ALT: 21 U/L (ref 0–55)
ANION GAP: 8 meq/L (ref 3–11)
AST: 18 U/L (ref 5–34)
Albumin: 3.9 g/dL (ref 3.5–5.0)
Alkaline Phosphatase: 65 U/L (ref 40–150)
BILIRUBIN TOTAL: 0.42 mg/dL (ref 0.20–1.20)
BUN: 20.2 mg/dL (ref 7.0–26.0)
CHLORIDE: 105 meq/L (ref 98–109)
CO2: 26 meq/L (ref 22–29)
Calcium: 9.9 mg/dL (ref 8.4–10.4)
Creatinine: 1.2 mg/dL — ABNORMAL HIGH (ref 0.6–1.1)
EGFR: 46 mL/min/{1.73_m2} — AB (ref >90)
GLUCOSE: 87 mg/dL (ref 70–140)
POTASSIUM: 4.1 meq/L (ref 3.5–5.1)
SODIUM: 139 meq/L (ref 136–145)
TOTAL PROTEIN: 7 g/dL (ref 6.4–8.3)

## 2016-06-12 LAB — CBC WITH DIFFERENTIAL/PLATELET
BASO%: 1 % (ref 0.0–2.0)
Basophils Absolute: 0.1 10*3/uL (ref 0.0–0.1)
EOS ABS: 0.2 10*3/uL (ref 0.0–0.5)
EOS%: 2.1 % (ref 0.0–7.0)
HCT: 41.5 % (ref 34.8–46.6)
HGB: 13.9 g/dL (ref 11.6–15.9)
LYMPH%: 34.7 % (ref 14.0–49.7)
MCH: 29.6 pg (ref 25.1–34.0)
MCHC: 33.5 g/dL (ref 31.5–36.0)
MCV: 88.2 fL (ref 79.5–101.0)
MONO#: 0.8 10*3/uL (ref 0.1–0.9)
MONO%: 11.4 % (ref 0.0–14.0)
NEUT%: 50.8 % (ref 38.4–76.8)
NEUTROS ABS: 3.8 10*3/uL (ref 1.5–6.5)
PLATELETS: 193 10*3/uL (ref 145–400)
RBC: 4.7 10*6/uL (ref 3.70–5.45)
RDW: 13.6 % (ref 11.2–14.5)
WBC: 7.4 10*3/uL (ref 3.9–10.3)
lymph#: 2.6 10*3/uL (ref 0.9–3.3)

## 2016-06-12 MED ORDER — DENOSUMAB 60 MG/ML ~~LOC~~ SOLN
60.0000 mg | Freq: Once | SUBCUTANEOUS | Status: AC
  Administered 2016-06-12: 60 mg via SUBCUTANEOUS
  Filled 2016-06-12: qty 1

## 2016-06-12 NOTE — Patient Instructions (Signed)
Denosumab injection  What is this medicine?  DENOSUMAB (den oh sue mab) slows bone breakdown. Prolia is used to treat osteoporosis in women after menopause and in men. Xgeva is used to prevent bone fractures and other bone problems caused by cancer bone metastases. Xgeva is also used to treat giant cell tumor of the bone.  This medicine may be used for other purposes; ask your health care provider or pharmacist if you have questions.  What should I tell my health care provider before I take this medicine?  They need to know if you have any of these conditions:  -dental disease  -eczema  -infection or history of infections  -kidney disease or on dialysis  -low blood calcium or vitamin D  -malabsorption syndrome  -scheduled to have surgery or tooth extraction  -taking medicine that contains denosumab  -thyroid or parathyroid disease  -an unusual reaction to denosumab, other medicines, foods, dyes, or preservatives  -pregnant or trying to get pregnant  -breast-feeding  How should I use this medicine?  This medicine is for injection under the skin. It is given by a health care professional in a hospital or clinic setting.  If you are getting Prolia, a special MedGuide will be given to you by the pharmacist with each prescription and refill. Be sure to read this information carefully each time.  For Prolia, talk to your pediatrician regarding the use of this medicine in children. Special care may be needed. For Xgeva, talk to your pediatrician regarding the use of this medicine in children. While this drug may be prescribed for children as young as 13 years for selected conditions, precautions do apply.  Overdosage: If you think you have taken too much of this medicine contact a poison control center or emergency room at once.  NOTE: This medicine is only for you. Do not share this medicine with others.  What if I miss a dose?  It is important not to miss your dose. Call your doctor or health care professional if you are  unable to keep an appointment.  What may interact with this medicine?  Do not take this medicine with any of the following medications:  -other medicines containing denosumab  This medicine may also interact with the following medications:  -medicines that suppress the immune system  -medicines that treat cancer  -steroid medicines like prednisone or cortisone  This list may not describe all possible interactions. Give your health care provider a list of all the medicines, herbs, non-prescription drugs, or dietary supplements you use. Also tell them if you smoke, drink alcohol, or use illegal drugs. Some items may interact with your medicine.  What should I watch for while using this medicine?  Visit your doctor or health care professional for regular checks on your progress. Your doctor or health care professional may order blood tests and other tests to see how you are doing.  Call your doctor or health care professional if you get a cold or other infection while receiving this medicine. Do not treat yourself. This medicine may decrease your body's ability to fight infection.  You should make sure you get enough calcium and vitamin D while you are taking this medicine, unless your doctor tells you not to. Discuss the foods you eat and the vitamins you take with your health care professional.  See your dentist regularly. Brush and floss your teeth as directed. Before you have any dental work done, tell your dentist you are receiving this medicine.  Do   not become pregnant while taking this medicine or for 5 months after stopping it. Women should inform their doctor if they wish to become pregnant or think they might be pregnant. There is a potential for serious side effects to an unborn child. Talk to your health care professional or pharmacist for more information.  What side effects may I notice from receiving this medicine?  Side effects that you should report to your doctor or health care professional as soon as  possible:  -allergic reactions like skin rash, itching or hives, swelling of the face, lips, or tongue  -breathing problems  -chest pain  -fast, irregular heartbeat  -feeling faint or lightheaded, falls  -fever, chills, or any other sign of infection  -muscle spasms, tightening, or twitches  -numbness or tingling  -skin blisters or bumps, or is dry, peels, or red  -slow healing or unexplained pain in the mouth or jaw  -unusual bleeding or bruising  Side effects that usually do not require medical attention (Report these to your doctor or health care professional if they continue or are bothersome.):  -muscle pain  -stomach upset, gas  This list may not describe all possible side effects. Call your doctor for medical advice about side effects. You may report side effects to FDA at 1-800-FDA-1088.  Where should I keep my medicine?  This medicine is only given in a clinic, doctor's office, or other health care setting and will not be stored at home.  NOTE: This sheet is a summary. It may not cover all possible information. If you have questions about this medicine, talk to your doctor, pharmacist, or health care provider.      2016, [employer] Standard. (2011-06-19 12:37:47)

## 2016-06-29 DIAGNOSIS — E039 Hypothyroidism, unspecified: Secondary | ICD-10-CM | POA: Diagnosis not present

## 2016-06-29 DIAGNOSIS — I129 Hypertensive chronic kidney disease with stage 1 through stage 4 chronic kidney disease, or unspecified chronic kidney disease: Secondary | ICD-10-CM | POA: Diagnosis not present

## 2016-06-29 DIAGNOSIS — C50912 Malignant neoplasm of unspecified site of left female breast: Secondary | ICD-10-CM | POA: Diagnosis not present

## 2016-06-29 DIAGNOSIS — C50919 Malignant neoplasm of unspecified site of unspecified female breast: Secondary | ICD-10-CM | POA: Diagnosis not present

## 2016-06-29 DIAGNOSIS — N183 Chronic kidney disease, stage 3 (moderate): Secondary | ICD-10-CM | POA: Diagnosis not present

## 2016-07-24 ENCOUNTER — Other Ambulatory Visit: Payer: Self-pay | Admitting: Internal Medicine

## 2016-07-24 DIAGNOSIS — E041 Nontoxic single thyroid nodule: Secondary | ICD-10-CM | POA: Diagnosis not present

## 2016-07-24 DIAGNOSIS — I129 Hypertensive chronic kidney disease with stage 1 through stage 4 chronic kidney disease, or unspecified chronic kidney disease: Secondary | ICD-10-CM | POA: Diagnosis not present

## 2016-07-24 DIAGNOSIS — S86811A Strain of other muscle(s) and tendon(s) at lower leg level, right leg, initial encounter: Secondary | ICD-10-CM | POA: Diagnosis not present

## 2016-07-24 DIAGNOSIS — Z853 Personal history of malignant neoplasm of breast: Secondary | ICD-10-CM | POA: Diagnosis not present

## 2016-07-24 DIAGNOSIS — E039 Hypothyroidism, unspecified: Secondary | ICD-10-CM | POA: Diagnosis not present

## 2016-07-24 DIAGNOSIS — R221 Localized swelling, mass and lump, neck: Secondary | ICD-10-CM

## 2016-07-24 DIAGNOSIS — R600 Localized edema: Secondary | ICD-10-CM | POA: Diagnosis not present

## 2016-07-25 ENCOUNTER — Ambulatory Visit
Admission: RE | Admit: 2016-07-25 | Discharge: 2016-07-25 | Disposition: A | Discharge status: Home / Self Care | Payer: Medicare Other | Source: Ambulatory Visit | Attending: Internal Medicine | Admitting: Internal Medicine

## 2016-07-25 DIAGNOSIS — R221 Localized swelling, mass and lump, neck: Secondary | ICD-10-CM

## 2016-07-25 DIAGNOSIS — R600 Localized edema: Secondary | ICD-10-CM

## 2016-07-25 DIAGNOSIS — M7989 Other specified soft tissue disorders: Secondary | ICD-10-CM | POA: Diagnosis not present

## 2016-07-25 DIAGNOSIS — E041 Nontoxic single thyroid nodule: Secondary | ICD-10-CM

## 2016-07-26 DIAGNOSIS — E041 Nontoxic single thyroid nodule: Secondary | ICD-10-CM | POA: Diagnosis not present

## 2016-07-26 DIAGNOSIS — L03115 Cellulitis of right lower limb: Secondary | ICD-10-CM | POA: Diagnosis not present

## 2016-07-26 DIAGNOSIS — W57XXXD Bitten or stung by nonvenomous insect and other nonvenomous arthropods, subsequent encounter: Secondary | ICD-10-CM | POA: Diagnosis not present

## 2016-08-28 DIAGNOSIS — I129 Hypertensive chronic kidney disease with stage 1 through stage 4 chronic kidney disease, or unspecified chronic kidney disease: Secondary | ICD-10-CM | POA: Diagnosis not present

## 2016-11-28 DIAGNOSIS — Z23 Encounter for immunization: Secondary | ICD-10-CM | POA: Diagnosis not present

## 2016-11-30 DIAGNOSIS — K5792 Diverticulitis of intestine, part unspecified, without perforation or abscess without bleeding: Secondary | ICD-10-CM | POA: Diagnosis not present

## 2016-11-30 DIAGNOSIS — E669 Obesity, unspecified: Secondary | ICD-10-CM | POA: Diagnosis not present

## 2016-11-30 DIAGNOSIS — N183 Chronic kidney disease, stage 3 (moderate): Secondary | ICD-10-CM | POA: Diagnosis not present

## 2016-11-30 DIAGNOSIS — I129 Hypertensive chronic kidney disease with stage 1 through stage 4 chronic kidney disease, or unspecified chronic kidney disease: Secondary | ICD-10-CM | POA: Diagnosis not present

## 2016-11-30 DIAGNOSIS — I872 Venous insufficiency (chronic) (peripheral): Secondary | ICD-10-CM | POA: Diagnosis not present

## 2016-11-30 DIAGNOSIS — Z6830 Body mass index (BMI) 30.0-30.9, adult: Secondary | ICD-10-CM | POA: Diagnosis not present

## 2016-12-08 ENCOUNTER — Telehealth: Payer: Self-pay | Admitting: Hematology and Oncology

## 2016-12-08 ENCOUNTER — Ambulatory Visit (HOSPITAL_BASED_OUTPATIENT_CLINIC_OR_DEPARTMENT_OTHER): Payer: Medicare Other | Admitting: Hematology and Oncology

## 2016-12-08 ENCOUNTER — Ambulatory Visit (HOSPITAL_BASED_OUTPATIENT_CLINIC_OR_DEPARTMENT_OTHER): Payer: Medicare Other

## 2016-12-08 ENCOUNTER — Other Ambulatory Visit (HOSPITAL_BASED_OUTPATIENT_CLINIC_OR_DEPARTMENT_OTHER): Payer: Medicare Other

## 2016-12-08 DIAGNOSIS — D0512 Intraductal carcinoma in situ of left breast: Secondary | ICD-10-CM | POA: Diagnosis not present

## 2016-12-08 DIAGNOSIS — M791 Myalgia, unspecified site: Secondary | ICD-10-CM | POA: Diagnosis not present

## 2016-12-08 DIAGNOSIS — M810 Age-related osteoporosis without current pathological fracture: Secondary | ICD-10-CM | POA: Diagnosis not present

## 2016-12-08 LAB — CBC WITH DIFFERENTIAL/PLATELET
BASO%: 0.9 % (ref 0.0–2.0)
BASOS ABS: 0.1 10*3/uL (ref 0.0–0.1)
EOS ABS: 0.1 10*3/uL (ref 0.0–0.5)
EOS%: 1.7 % (ref 0.0–7.0)
HEMATOCRIT: 40.1 % (ref 34.8–46.6)
HEMOGLOBIN: 13.5 g/dL (ref 11.6–15.9)
LYMPH#: 2.6 10*3/uL (ref 0.9–3.3)
LYMPH%: 40.8 % (ref 14.0–49.7)
MCH: 29.9 pg (ref 25.1–34.0)
MCHC: 33.7 g/dL (ref 31.5–36.0)
MCV: 88.6 fL (ref 79.5–101.0)
MONO#: 0.6 10*3/uL (ref 0.1–0.9)
MONO%: 10 % (ref 0.0–14.0)
NEUT#: 3 10*3/uL (ref 1.5–6.5)
NEUT%: 46.6 % (ref 38.4–76.8)
PLATELETS: 189 10*3/uL (ref 145–400)
RBC: 4.52 10*6/uL (ref 3.70–5.45)
RDW: 13.3 % (ref 11.2–14.5)
WBC: 6.5 10*3/uL (ref 3.9–10.3)

## 2016-12-08 LAB — COMPREHENSIVE METABOLIC PANEL
ALT: 26 U/L (ref 0–55)
ANION GAP: 10 meq/L (ref 3–11)
AST: 22 U/L (ref 5–34)
Albumin: 3.8 g/dL (ref 3.5–5.0)
Alkaline Phosphatase: 58 U/L (ref 40–150)
BUN: 19.6 mg/dL (ref 7.0–26.0)
CHLORIDE: 103 meq/L (ref 98–109)
CO2: 25 meq/L (ref 22–29)
Calcium: 9.4 mg/dL (ref 8.4–10.4)
Creatinine: 1.1 mg/dL (ref 0.6–1.1)
EGFR: 48 mL/min/{1.73_m2} — ABNORMAL LOW (ref >60)
Glucose: 99 mg/dl (ref 70–140)
Potassium: 3.6 mEq/L (ref 3.5–5.1)
Sodium: 139 mEq/L (ref 136–145)
Total Bilirubin: 0.48 mg/dL (ref 0.20–1.20)
Total Protein: 6.8 g/dL (ref 6.4–8.3)

## 2016-12-08 MED ORDER — DENOSUMAB 60 MG/ML ~~LOC~~ SOLN
60.0000 mg | Freq: Once | SUBCUTANEOUS | Status: AC
  Administered 2016-12-08: 60 mg via SUBCUTANEOUS
  Filled 2016-12-08: qty 1

## 2016-12-08 NOTE — Assessment & Plan Note (Signed)
DCIS left breast status post lumpectomy and radiation, could not tolerate tamoxifen because of superficial venous thrombosis (started 10/02/2012) currently on Arimidex since 10/02/2013.  Elevated ALT and AST and alkaline phosphatase: Ultrasound of the liver showed fatty liver. These abnormal blood test had normalized after stopping Aromasin and switching her to Arimidex. Her primary care physician is monitoring her liver.  Breast Cancer Surveillance: 1. Breast exam : 06/08/15 no concerning findings. 2. Mammogram 04/19/2015 No abnormalities. Postsurgical changes. Breast Density Category B. I recommended that she get 3-D mammograms for surveillance. Discussed the differences between different breast density categories. 3. Bone density: Osteoporosis T score at -2.5 On Prolia every 6 months started June 2017  Anastrozole toxicities: 1. Muscle aches and pains: Intermittently she stops anastrozole which seems to have helped.  I discussed with her that she has 1 more year of antiestrogen therapy and then she can stop taking the antiestrogen pills.  She takes probiotics Patient tore hamstring muscle  Return to clinic in 1 year for follow-up.

## 2016-12-08 NOTE — Telephone Encounter (Signed)
Gave patient AVS. Patient declined calendar of upcoming December 2019 appointments.

## 2016-12-08 NOTE — Progress Notes (Signed)
Patient Care Team: Lajean Manes, MD as PCP - General (Internal Medicine)  DIAGNOSIS:  Encounter Diagnosis  Name Primary?  . Neoplasm of left breast, primary tumor staging category Tis: ductal carcinoma in situ (DCIS)     SUMMARY OF ONCOLOGIC HISTORY:   Neoplasm of left breast, primary tumor staging category Tis: ductal carcinoma in situ (DCIS)   06/24/2012 Surgery    Left breast lumpectomy and sentinel lymph node biopsy DCIS      08/30/2012 - 09/28/2012 Radiation Therapy    Radiation to lumpectomy site      10/02/2012 - 10/02/2013 Anti-estrogen oral therapy    Tamoxifen 20 mg daily started 10/02/2012 discontinued due to superficial leg vein thrombosis. Aromasin 25 mg started July 2015 stopped for muscle aches, restarted 08/28/2013 stopped in 10/02/13 due to elevation AST adn ALT      10/02/2013 -  Anti-estrogen oral therapy    Arimidex 1 mg daily      12/23/2015 Miscellaneous    Genetic testing: Negative for any mutations of 20 genes on the breast/ovarian cancer panel through [facility]       CHIEF COMPLIANT: Follow-up on anastrozole therapy  INTERVAL HISTORY: Laurie Gomez is a 76 year old with above-mentioned history of left breast DCIS who initially was on tamoxifen but had developed superficial vein thrombosis.  She was then switched to Aromasin and that was switched to Arimidex because of elevation of AST and ALT.  Since then she has been doing reasonably well on Arimidex.  She does have some muscle aches and pains but otherwise doing quite well.  Denies any lumps or nodules in the breast.  She had an episode of diverticulitis which has resolved.  REVIEW OF SYSTEMS:   Constitutional: Denies fevers, chills or abnormal weight loss Eyes: Denies blurriness of vision Ears, nose, mouth, throat, and face: Denies mucositis or sore throat Respiratory: Denies cough, dyspnea or wheezes Cardiovascular: Denies palpitation, chest discomfort Gastrointestinal: Recent  diverticulitis Skin: Denies abnormal skin rashes Lymphatics: Denies new lymphadenopathy or easy bruising Neurological:Denies numbness, tingling or new weaknesses Behavioral/Psych: Mood is stable, no new changes  Extremities: No lower extremity edema Breast:  denies any pain or lumps or nodules in either breasts All other systems were reviewed with the patient and are negative.  I have reviewed the past medical history, past surgical history, social history and family history with the patient and they are unchanged from previous note.  ALLERGIES:  has No Known Allergies.  MEDICATIONS:  Current Outpatient Medications  Medication Sig Dispense Refill  . amlodipine-benazepril (LOTREL) 2.5-10 MG per capsule   3  . anastrozole (ARIMIDEX) 1 MG tablet Take 1 tablet (1 mg total) by mouth daily. 90 tablet 2  . levothyroxine (SYNTHROID) 75 MCG tablet Take 75 mcg by mouth daily before breakfast.     . Multiple Vitamin (MULTIVITAMIN) tablet Take 1 tablet by mouth daily.     . Psyllium (WAL-MUCIL PO) Take 2 tablets by mouth daily.    Marland Kitchen saccharomyces boulardii (FLORASTOR) 250 MG capsule Take 1 capsule (250 mg total) by mouth 2 (two) times daily.    Marland Kitchen triamterene-hydrochlorothiazide (MAXZIDE) 75-50 MG per tablet Take 1 tablet by mouth daily. Take 1/2 tablet daily.    . vitamin B-12 (CYANOCOBALAMIN) 50 MCG tablet Take 2 tablets by mouth daily.      No current facility-administered medications for this visit.     PHYSICAL EXAMINATION: ECOG PERFORMANCE STATUS: 1 - Symptomatic but completely ambulatory  Vitals:   12/08/16 2956  BP: 128/61  Pulse: 66  Resp: 20  Temp: 98.1 F (36.7 C)  SpO2: 100%   Filed Weights   12/08/16 0853  Weight: 174 lb 14.4 oz (79.3 kg)    GENERAL:alert, no distress and comfortable SKIN: skin color, texture, turgor are normal, no rashes or significant lesions EYES: normal, Conjunctiva are pink and non-injected, sclera clear OROPHARYNX:no exudate, no erythema and  lips, buccal mucosa, and tongue normal  NECK: supple, thyroid normal size, non-tender, without nodularity LYMPH:  no palpable lymphadenopathy in the cervical, axillary or inguinal LUNGS: clear to auscultation and percussion with normal breathing effort HEART: regular rate & rhythm and no murmurs and no lower extremity edema ABDOMEN:abdomen soft, non-tender and normal bowel sounds MUSCULOSKELETAL:no cyanosis of digits and no clubbing  NEURO: alert & oriented x 3 with fluent speech, no focal motor/sensory deficits EXTREMITIES: No lower extremity edema BREAST: No palpable masses or nodules in either right or left breasts. No palpable axillary supraclavicular or infraclavicular adenopathy no breast tenderness or nipple discharge. (exam performed in the presence of a chaperone)  LABORATORY DATA:  I have reviewed the data as listed   Chemistry      Component Value Date/Time   NA 139 12/08/2016 0828   K 3.6 12/08/2016 0828   CL 107 12/26/2013 0435   CO2 25 12/08/2016 0828   BUN 19.6 12/08/2016 0828   CREATININE 1.1 12/08/2016 0828      Component Value Date/Time   CALCIUM 9.4 12/08/2016 0828   ALKPHOS 58 12/08/2016 0828   AST 22 12/08/2016 0828   ALT 26 12/08/2016 0828   BILITOT 0.48 12/08/2016 0828       Lab Results  Component Value Date   WBC 6.5 12/08/2016   HGB 13.5 12/08/2016   HCT 40.1 12/08/2016   MCV 88.6 12/08/2016   PLT 189 12/08/2016   NEUTROABS 3.0 12/08/2016    ASSESSMENT & PLAN:  Neoplasm of left breast, primary tumor staging category Tis: ductal carcinoma in situ (DCIS) DCIS left breast status post lumpectomy and radiation, could not tolerate tamoxifen because of superficial venous thrombosis (started 10/02/2012) currently on Arimidex since 10/02/2013.  Elevated ALT and AST and alkaline phosphatase: Ultrasound of the liver showed fatty liver. These abnormal blood test had normalized after stopping Aromasin and switching her to Arimidex. Her primary care  physician is monitoring her liver.  Breast Cancer Surveillance: 1. Breast exam : 06/08/15 no concerning findings. 2. Mammogram 04/19/2015 No abnormalities. Postsurgical changes. Breast Density Category B. I recommended that she get 3-D mammograms for surveillance. Discussed the differences between different breast density categories. 3. Bone density: Osteoporosis T score at -2.5 On Prolia every 6 months started June 2017  Anastrozole toxicities: 1. Muscle aches and pains: Intermittently she stops anastrozole which seems to have helped. Patient is very tired of takingantiestrogen therapy.  She wants to see if her muscle aches and pains would improve by stopping it.  She will stop anastrozole for a month and decide in January if she wants to resume it for the rest of the year.  She needs to take it for 1 more year.  I discussed with her that she has 1 more year of antiestrogen therapy and then she can stop taking the antiestrogen pills.  She takes probiotics Patient tore hamstring muscle  Return to clinic in 1 year for follow-up.    I spent 25 minutes talking to the patient of which more than half was spent in counseling and coordination of care.  No  orders of the defined types were placed in this encounter.  The patient has a good understanding of the overall plan. she agrees with it. she will call with any problems that may develop before the next visit here.   Rulon Eisenmenger, MD 12/08/16

## 2016-12-13 ENCOUNTER — Telehealth: Payer: Self-pay | Admitting: Hematology and Oncology

## 2016-12-13 NOTE — Telephone Encounter (Signed)
Scheduled appt per 12/07 sch message - sent reminder letter in the mail with appt date and time.

## 2017-04-24 ENCOUNTER — Other Ambulatory Visit: Payer: Self-pay | Admitting: Hematology and Oncology

## 2017-04-24 DIAGNOSIS — Z853 Personal history of malignant neoplasm of breast: Secondary | ICD-10-CM

## 2017-04-27 DIAGNOSIS — I129 Hypertensive chronic kidney disease with stage 1 through stage 4 chronic kidney disease, or unspecified chronic kidney disease: Secondary | ICD-10-CM | POA: Diagnosis not present

## 2017-04-27 DIAGNOSIS — N183 Chronic kidney disease, stage 3 (moderate): Secondary | ICD-10-CM | POA: Diagnosis not present

## 2017-04-27 DIAGNOSIS — Z1389 Encounter for screening for other disorder: Secondary | ICD-10-CM | POA: Diagnosis not present

## 2017-04-27 DIAGNOSIS — E039 Hypothyroidism, unspecified: Secondary | ICD-10-CM | POA: Diagnosis not present

## 2017-04-27 DIAGNOSIS — Z0000 Encounter for general adult medical examination without abnormal findings: Secondary | ICD-10-CM | POA: Diagnosis not present

## 2017-04-27 DIAGNOSIS — E669 Obesity, unspecified: Secondary | ICD-10-CM | POA: Diagnosis not present

## 2017-04-27 DIAGNOSIS — Z79899 Other long term (current) drug therapy: Secondary | ICD-10-CM | POA: Diagnosis not present

## 2017-04-27 DIAGNOSIS — Z6830 Body mass index (BMI) 30.0-30.9, adult: Secondary | ICD-10-CM | POA: Diagnosis not present

## 2017-04-30 ENCOUNTER — Ambulatory Visit
Admission: RE | Admit: 2017-04-30 | Discharge: 2017-04-30 | Disposition: A | Discharge status: Home / Self Care | Payer: Medicare Other | Source: Ambulatory Visit | Attending: Hematology and Oncology | Admitting: Hematology and Oncology

## 2017-04-30 DIAGNOSIS — R922 Inconclusive mammogram: Secondary | ICD-10-CM | POA: Diagnosis not present

## 2017-04-30 DIAGNOSIS — Z853 Personal history of malignant neoplasm of breast: Secondary | ICD-10-CM

## 2017-06-08 ENCOUNTER — Inpatient Hospital Stay: Payer: Medicare Other | Attending: Hematology and Oncology

## 2017-06-08 ENCOUNTER — Other Ambulatory Visit: Payer: Self-pay

## 2017-06-08 ENCOUNTER — Inpatient Hospital Stay: Payer: Medicare Other

## 2017-06-08 VITALS — BP 148/69 | HR 65 | Temp 98.3°F | Resp 18

## 2017-06-08 DIAGNOSIS — D0512 Intraductal carcinoma in situ of left breast: Secondary | ICD-10-CM | POA: Diagnosis not present

## 2017-06-08 DIAGNOSIS — M810 Age-related osteoporosis without current pathological fracture: Secondary | ICD-10-CM | POA: Diagnosis not present

## 2017-06-08 LAB — CMP (CANCER CENTER ONLY)
ALBUMIN: 4.2 g/dL (ref 3.5–5.0)
ALK PHOS: 74 U/L (ref 40–150)
ALT: 14 U/L (ref 0–55)
ANION GAP: 11 (ref 3–11)
AST: 16 U/L (ref 5–34)
BILIRUBIN TOTAL: 0.4 mg/dL (ref 0.2–1.2)
BUN: 18 mg/dL (ref 7–26)
CALCIUM: 9.4 mg/dL (ref 8.4–10.4)
CO2: 23 mmol/L (ref 22–29)
Chloride: 104 mmol/L (ref 98–109)
Creatinine: 1.16 mg/dL — ABNORMAL HIGH (ref 0.60–1.10)
GFR, EST AFRICAN AMERICAN: 52 mL/min — AB (ref >60)
GFR, Estimated: 45 mL/min — ABNORMAL LOW (ref >60)
GLUCOSE: 113 mg/dL (ref 70–140)
POTASSIUM: 3.9 mmol/L (ref 3.5–5.1)
SODIUM: 138 mmol/L (ref 136–145)
TOTAL PROTEIN: 7.4 g/dL (ref 6.4–8.3)

## 2017-06-08 LAB — CBC WITH DIFFERENTIAL (CANCER CENTER ONLY)
BASOS ABS: 0.1 10*3/uL (ref 0.0–0.1)
BASOS PCT: 1 %
Eosinophils Absolute: 0.2 10*3/uL (ref 0.0–0.5)
Eosinophils Relative: 2 %
HEMATOCRIT: 41.4 % (ref 34.8–46.6)
HEMOGLOBIN: 13.7 g/dL (ref 11.6–15.9)
LYMPHS PCT: 33 %
Lymphs Abs: 2.8 10*3/uL (ref 0.9–3.3)
MCH: 29.7 pg (ref 25.1–34.0)
MCHC: 33.1 g/dL (ref 31.5–36.0)
MCV: 89.7 fL (ref 79.5–101.0)
Monocytes Absolute: 0.8 10*3/uL (ref 0.1–0.9)
Monocytes Relative: 9 %
NEUTROS ABS: 4.8 10*3/uL (ref 1.5–6.5)
NEUTROS PCT: 55 %
Platelet Count: 179 10*3/uL (ref 145–400)
RBC: 4.61 MIL/uL (ref 3.70–5.45)
RDW: 13.4 % (ref 11.2–14.5)
WBC: 8.6 10*3/uL (ref 3.9–10.3)

## 2017-06-08 MED ORDER — DENOSUMAB 60 MG/ML ~~LOC~~ SOLN: 60.0000 mg | Freq: Once | SUBCUTANEOUS | Status: DC

## 2017-06-08 MED ORDER — DENOSUMAB 60 MG/ML ~~LOC~~ SOSY
60.0000 mg | PREFILLED_SYRINGE | Freq: Once | SUBCUTANEOUS | Status: AC
  Administered 2017-06-08: 60 mg via SUBCUTANEOUS

## 2017-06-08 NOTE — Patient Instructions (Signed)
Denosumab injection  What is this medicine?  DENOSUMAB (den oh sue mab) slows bone breakdown. Prolia is used to treat osteoporosis in women after menopause and in men. Xgeva is used to prevent bone fractures and other bone problems caused by cancer bone metastases. Xgeva is also used to treat giant cell tumor of the bone.  This medicine may be used for other purposes; ask your health care provider or pharmacist if you have questions.  What should I tell my health care provider before I take this medicine?  They need to know if you have any of these conditions:  -dental disease  -eczema  -infection or history of infections  -kidney disease or on dialysis  -low blood calcium or vitamin D  -malabsorption syndrome  -scheduled to have surgery or tooth extraction  -taking medicine that contains denosumab  -thyroid or parathyroid disease  -an unusual reaction to denosumab, other medicines, foods, dyes, or preservatives  -pregnant or trying to get pregnant  -breast-feeding  How should I use this medicine?  This medicine is for injection under the skin. It is given by a health care professional in a hospital or clinic setting.  If you are getting Prolia, a special MedGuide will be given to you by the pharmacist with each prescription and refill. Be sure to read this information carefully each time.  For Prolia, talk to your pediatrician regarding the use of this medicine in children. Special care may be needed. For Xgeva, talk to your pediatrician regarding the use of this medicine in children. While this drug may be prescribed for children as young as 13 years for selected conditions, precautions do apply.  Overdosage: If you think you have taken too much of this medicine contact a poison control center or emergency room at once.  NOTE: This medicine is only for you. Do not share this medicine with others.  What if I miss a dose?  It is important not to miss your dose. Call your doctor or health care professional if you are  unable to keep an appointment.  What may interact with this medicine?  Do not take this medicine with any of the following medications:  -other medicines containing denosumab  This medicine may also interact with the following medications:  -medicines that suppress the immune system  -medicines that treat cancer  -steroid medicines like prednisone or cortisone  This list may not describe all possible interactions. Give your health care provider a list of all the medicines, herbs, non-prescription drugs, or dietary supplements you use. Also tell them if you smoke, drink alcohol, or use illegal drugs. Some items may interact with your medicine.  What should I watch for while using this medicine?  Visit your doctor or health care professional for regular checks on your progress. Your doctor or health care professional may order blood tests and other tests to see how you are doing.  Call your doctor or health care professional if you get a cold or other infection while receiving this medicine. Do not treat yourself. This medicine may decrease your body's ability to fight infection.  You should make sure you get enough calcium and vitamin D while you are taking this medicine, unless your doctor tells you not to. Discuss the foods you eat and the vitamins you take with your health care professional.  See your dentist regularly. Brush and floss your teeth as directed. Before you have any dental work done, tell your dentist you are receiving this medicine.  Do   not become pregnant while taking this medicine or for 5 months after stopping it. Women should inform their doctor if they wish to become pregnant or think they might be pregnant. There is a potential for serious side effects to an unborn child. Talk to your health care professional or pharmacist for more information.  What side effects may I notice from receiving this medicine?  Side effects that you should report to your doctor or health care professional as soon as  possible:  -allergic reactions like skin rash, itching or hives, swelling of the face, lips, or tongue  -breathing problems  -chest pain  -fast, irregular heartbeat  -feeling faint or lightheaded, falls  -fever, chills, or any other sign of infection  -muscle spasms, tightening, or twitches  -numbness or tingling  -skin blisters or bumps, or is dry, peels, or red  -slow healing or unexplained pain in the mouth or jaw  -unusual bleeding or bruising  Side effects that usually do not require medical attention (Report these to your doctor or health care professional if they continue or are bothersome.):  -muscle pain  -stomach upset, gas  This list may not describe all possible side effects. Call your doctor for medical advice about side effects. You may report side effects to FDA at 1-800-FDA-1088.  Where should I keep my medicine?  This medicine is only given in a clinic, doctor's office, or other health care setting and will not be stored at home.  NOTE: This sheet is a summary. It may not cover all possible information. If you have questions about this medicine, talk to your doctor, pharmacist, or health care provider.      2016, [employer] Standard. (2011-06-19 12:37:47)

## 2017-06-24 DIAGNOSIS — R1031 Right lower quadrant pain: Secondary | ICD-10-CM | POA: Diagnosis not present

## 2017-10-30 DIAGNOSIS — L729 Follicular cyst of the skin and subcutaneous tissue, unspecified: Secondary | ICD-10-CM | POA: Diagnosis not present

## 2017-10-30 DIAGNOSIS — I129 Hypertensive chronic kidney disease with stage 1 through stage 4 chronic kidney disease, or unspecified chronic kidney disease: Secondary | ICD-10-CM | POA: Diagnosis not present

## 2017-10-30 DIAGNOSIS — J301 Allergic rhinitis due to pollen: Secondary | ICD-10-CM | POA: Diagnosis not present

## 2017-10-30 DIAGNOSIS — N183 Chronic kidney disease, stage 3 (moderate): Secondary | ICD-10-CM | POA: Diagnosis not present

## 2017-10-30 DIAGNOSIS — Z23 Encounter for immunization: Secondary | ICD-10-CM | POA: Diagnosis not present

## 2017-11-11 DIAGNOSIS — R1032 Left lower quadrant pain: Secondary | ICD-10-CM | POA: Diagnosis not present

## 2017-11-12 DIAGNOSIS — M25561 Pain in right knee: Secondary | ICD-10-CM | POA: Diagnosis not present

## 2017-12-07 ENCOUNTER — Inpatient Hospital Stay: Payer: Medicare Other | Attending: Hematology and Oncology | Admitting: Hematology and Oncology

## 2017-12-07 ENCOUNTER — Telehealth: Payer: Self-pay | Admitting: Hematology and Oncology

## 2017-12-07 VITALS — BP 127/89 | HR 67 | Temp 98.1°F | Resp 17 | Ht 65.0 in | Wt 174.5 lb

## 2017-12-07 DIAGNOSIS — M810 Age-related osteoporosis without current pathological fracture: Secondary | ICD-10-CM | POA: Insufficient documentation

## 2017-12-07 DIAGNOSIS — R740 Nonspecific elevation of levels of transaminase and lactic acid dehydrogenase [LDH]: Secondary | ICD-10-CM | POA: Diagnosis not present

## 2017-12-07 DIAGNOSIS — Z86000 Personal history of in-situ neoplasm of breast: Secondary | ICD-10-CM | POA: Insufficient documentation

## 2017-12-07 DIAGNOSIS — R748 Abnormal levels of other serum enzymes: Secondary | ICD-10-CM | POA: Diagnosis not present

## 2017-12-07 DIAGNOSIS — D0512 Intraductal carcinoma in situ of left breast: Secondary | ICD-10-CM

## 2017-12-07 NOTE — Progress Notes (Signed)
Patient Care Team: Lajean Manes, MD as PCP - General (Internal Medicine)  DIAGNOSIS:  Encounter Diagnosis  Name Primary?  . Neoplasm of left breast, primary tumor staging category Tis: ductal carcinoma in situ (DCIS)     SUMMARY OF ONCOLOGIC HISTORY:   Neoplasm of left breast, primary tumor staging category Tis: ductal carcinoma in situ (DCIS)   06/24/2012 Surgery    Left breast lumpectomy and sentinel lymph node biopsy DCIS    08/30/2012 - 09/28/2012 Radiation Therapy    Radiation to lumpectomy site    10/02/2012 - 10/02/2013 Anti-estrogen oral therapy    Tamoxifen 20 mg daily started 10/02/2012 discontinued due to superficial leg vein thrombosis. Aromasin 25 mg started July 2015 stopped for muscle aches, restarted 08/28/2013 stopped in 10/02/13 due to elevation AST adn ALT    10/02/2013 -  Anti-estrogen oral therapy    Arimidex 1 mg daily    12/23/2015 Miscellaneous    Genetic testing: Negative for any mutations of 20 genes on the breast/ovarian cancer panel through [facility]     CHIEF COMPLIANT: Follow-up of DCIS having completed 5 years of antiestrogen therapy  INTERVAL HISTORY: Laurie Gomez is a 77 year old with above-mentioned insert DCIS who underwent lumpectomy and took 5 years of antiestrogen therapy.  The last treatment that she was on was her Imitrex and she had lots of muscle aches and pains.  She had taken it intermittently and had a hard time taking it.  She completed 5 years of total therapy and will discontinue further treatment.  Denies any lumps or nodules in the breast.  REVIEW OF SYSTEMS:   Constitutional: Denies fevers, chills or abnormal weight loss Eyes: Denies blurriness of vision Ears, nose, mouth, throat, and face: Denies mucositis or sore throat Respiratory: Denies cough, dyspnea or wheezes Cardiovascular: Denies palpitation, chest discomfort Gastrointestinal:  Denies nausea, heartburn or change in bowel habits Skin: Denies abnormal  skin rashes Lymphatics: Denies new lymphadenopathy or easy bruising Neurological:Denies numbness, tingling or new weaknesses Behavioral/Psych: Mood is stable, no new changes  Extremities: No lower extremity edema Breast:  denies any pain or lumps or nodules in either breasts All other systems were reviewed with the patient and are negative.  I have reviewed the past medical history, past surgical history, social history and family history with the patient and they are unchanged from previous note.  ALLERGIES:  has No Known Allergies.  MEDICATIONS:  Current Outpatient Medications  Medication Sig Dispense Refill  . amlodipine-benazepril (LOTREL) 2.5-10 MG per capsule   3  . anastrozole (ARIMIDEX) 1 MG tablet Take 1 tablet (1 mg total) by mouth daily. 90 tablet 2  . levothyroxine (SYNTHROID) 75 MCG tablet Take 75 mcg by mouth daily before breakfast.     . Multiple Vitamin (MULTIVITAMIN) tablet Take 1 tablet by mouth daily.     . Psyllium (WAL-MUCIL PO) Take 2 tablets by mouth daily.    Marland Kitchen saccharomyces boulardii (FLORASTOR) 250 MG capsule Take 1 capsule (250 mg total) by mouth 2 (two) times daily.    Marland Kitchen triamterene-hydrochlorothiazide (MAXZIDE) 75-50 MG per tablet Take 1 tablet by mouth daily. Take 1/2 tablet daily.    . vitamin B-12 (CYANOCOBALAMIN) 50 MCG tablet Take 2 tablets by mouth daily.      No current facility-administered medications for this visit.     PHYSICAL EXAMINATION: ECOG PERFORMANCE STATUS: 1 - Symptomatic but completely ambulatory  Vitals:   12/07/17 0850  BP: 127/89  Pulse: 67  Resp: 17  Temp:  98.1 F (36.7 C)  SpO2: 99%   Filed Weights   12/07/17 0850  Weight: 174 lb 8 oz (79.2 kg)    GENERAL:alert, no distress and comfortable SKIN: skin color, texture, turgor are normal, no rashes or significant lesions EYES: normal, Conjunctiva are pink and non-injected, sclera clear OROPHARYNX:no exudate, no erythema and lips, buccal mucosa, and tongue normal  NECK:  supple, thyroid normal size, non-tender, without nodularity LYMPH:  no palpable lymphadenopathy in the cervical, axillary or inguinal LUNGS: clear to auscultation and percussion with normal breathing effort HEART: regular rate & rhythm and no murmurs and no lower extremity edema ABDOMEN:abdomen soft, non-tender and normal bowel sounds MUSCULOSKELETAL:no cyanosis of digits and no clubbing  NEURO: alert & oriented x 3 with fluent speech, no focal motor/sensory deficits EXTREMITIES: No lower extremity edema BREAST: No palpable masses or nodules in either right or left breasts. No palpable axillary supraclavicular or infraclavicular adenopathy no breast tenderness or nipple discharge. (exam performed in the presence of a chaperone)  LABORATORY DATA:  I have reviewed the data as listed CMP Latest Ref Rng & Units 06/08/2017 12/08/2016 06/12/2016  Glucose 70 - 140 mg/dL 113 99 87  BUN 7 - 26 mg/dL 18 19.6 20.2  Creatinine 0.60 - 1.10 mg/dL 1.16(H) 1.1 1.2(H)  Sodium 136 - 145 mmol/L 138 139 139  Potassium 3.5 - 5.1 mmol/L 3.9 3.6 4.1  Chloride 98 - 109 mmol/L 104 - -  CO2 22 - 29 mmol/L 23 25 26   Calcium 8.4 - 10.4 mg/dL 9.4 9.4 9.9  Total Protein 6.4 - 8.3 g/dL 7.4 6.8 7.0  Total Bilirubin 0.2 - 1.2 mg/dL 0.4 0.48 0.42  Alkaline Phos 40 - 150 U/L 74 58 65  AST 5 - 34 U/L 16 22 18   ALT 0 - 55 U/L 14 26 21     Lab Results  Component Value Date   WBC 8.6 06/08/2017   HGB 13.7 06/08/2017   HCT 41.4 06/08/2017   MCV 89.7 06/08/2017   PLT 179 06/08/2017   NEUTROABS 4.8 06/08/2017    ASSESSMENT & PLAN:  Neoplasm of left breast, primary tumor staging category Tis: ductal carcinoma in situ (DCIS) DCIS left breaststatus post lumpectomy and radiation, could not tolerate tamoxifen because of superficial venous thrombosis (started 10/02/2012) currently on Arimidex since 10/02/2013.  Elevated ALT and ASTand alkaline phosphatase: Ultrasound of the liver showed fatty liver. These abnormal blood  test had normalized after stopping Aromasin and switching her to Arimidex. Her primary care physician is monitoring her liver.  Breast Cancer Surveillance: 1. Breast exam : 12/07/2017 no concerning findings. 2. Mammogram 04/30/2017 no abnormalities. Postsurgical changes. Breast Density Category C. 3. Bone density: OsteoporosisT score at -2.5 On Prolia every 6 months started June 2017  Anastrozole toxicities: 1. Muscle aches and pains:  Return to clinic in 1 year for follow-up with long-term survivorship    No orders of the defined types were placed in this encounter.  The patient has a good understanding of the overall plan. she agrees with it. she will call with any problems that may develop before the next visit here.   Harriette Ohara, MD 12/07/17

## 2017-12-07 NOTE — Assessment & Plan Note (Signed)
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  Elevated ALT and ASTand alkaline phosphatase: Ultrasound of the liver showed fatty liver. These abnormal blood test had normalized after stopping Aromasin and switching her to Arimidex. Her primary care physician is monitoring her liver.  Breast Cancer Surveillance: 1. Breast exam : 12/07/2017 no concerning findings. 2. Mammogram 04/30/2017 no abnormalities. Postsurgical changes. Breast Density Category C. 3. Bone density: OsteoporosisT score at -2.5 On Prolia every 6 months started June 2017  Anastrozole toxicities: 1. Muscle aches and pains:  Return to clinic in 1 year for follow-up with long-term survivorship

## 2017-12-07 NOTE — Telephone Encounter (Signed)
Gave avs and calendar ° °

## 2017-12-13 ENCOUNTER — Other Ambulatory Visit: Payer: Self-pay

## 2017-12-13 DIAGNOSIS — D0512 Intraductal carcinoma in situ of left breast: Secondary | ICD-10-CM

## 2017-12-13 DIAGNOSIS — Z803 Family history of malignant neoplasm of breast: Secondary | ICD-10-CM

## 2017-12-14 ENCOUNTER — Inpatient Hospital Stay: Payer: Medicare Other

## 2017-12-14 VITALS — BP 161/77 | HR 71 | Temp 97.8°F | Resp 18

## 2017-12-14 DIAGNOSIS — D0512 Intraductal carcinoma in situ of left breast: Secondary | ICD-10-CM

## 2017-12-14 DIAGNOSIS — M810 Age-related osteoporosis without current pathological fracture: Secondary | ICD-10-CM

## 2017-12-14 DIAGNOSIS — R748 Abnormal levels of other serum enzymes: Secondary | ICD-10-CM | POA: Diagnosis not present

## 2017-12-14 DIAGNOSIS — Z86000 Personal history of in-situ neoplasm of breast: Secondary | ICD-10-CM | POA: Diagnosis not present

## 2017-12-14 DIAGNOSIS — R740 Nonspecific elevation of levels of transaminase and lactic acid dehydrogenase [LDH]: Secondary | ICD-10-CM | POA: Diagnosis not present

## 2017-12-14 LAB — CBC WITH DIFFERENTIAL (CANCER CENTER ONLY)
ABS IMMATURE GRANULOCYTES: 0.01 10*3/uL (ref 0.00–0.07)
Basophils Absolute: 0.1 10*3/uL (ref 0.0–0.1)
Basophils Relative: 1 %
Eosinophils Absolute: 0.2 10*3/uL (ref 0.0–0.5)
Eosinophils Relative: 2 %
HEMATOCRIT: 40.4 % (ref 36.0–46.0)
HEMOGLOBIN: 13.2 g/dL (ref 12.0–15.0)
Immature Granulocytes: 0 %
LYMPHS ABS: 2.7 10*3/uL (ref 0.7–4.0)
Lymphocytes Relative: 38 %
MCH: 29.3 pg (ref 26.0–34.0)
MCHC: 32.7 g/dL (ref 30.0–36.0)
MCV: 89.8 fL (ref 80.0–100.0)
Monocytes Absolute: 0.8 10*3/uL (ref 0.1–1.0)
Monocytes Relative: 12 %
NEUTROS ABS: 3.2 10*3/uL (ref 1.7–7.7)
Neutrophils Relative %: 47 %
Platelet Count: 201 10*3/uL (ref 150–400)
RBC: 4.5 MIL/uL (ref 3.87–5.11)
RDW: 13 % (ref 11.5–15.5)
WBC Count: 7 10*3/uL (ref 4.0–10.5)
nRBC: 0 % (ref 0.0–0.2)

## 2017-12-14 LAB — CMP (CANCER CENTER ONLY)
ALBUMIN: 3.7 g/dL (ref 3.5–5.0)
ALK PHOS: 62 U/L (ref 38–126)
ALT: 14 U/L (ref 0–44)
AST: 14 U/L — AB (ref 15–41)
Anion gap: 10 (ref 5–15)
BILIRUBIN TOTAL: 0.4 mg/dL (ref 0.3–1.2)
BUN: 22 mg/dL (ref 8–23)
CO2: 26 mmol/L (ref 22–32)
CREATININE: 1.06 mg/dL — AB (ref 0.44–1.00)
Calcium: 9.3 mg/dL (ref 8.9–10.3)
Chloride: 105 mmol/L (ref 98–111)
GFR, Est AFR Am: 59 mL/min — ABNORMAL LOW (ref >60)
GFR, Estimated: 51 mL/min — ABNORMAL LOW (ref >60)
GLUCOSE: 80 mg/dL (ref 70–99)
POTASSIUM: 4 mmol/L (ref 3.5–5.1)
Sodium: 141 mmol/L (ref 135–145)
TOTAL PROTEIN: 6.8 g/dL (ref 6.5–8.1)

## 2017-12-14 MED ORDER — DENOSUMAB 60 MG/ML ~~LOC~~ SOSY
60.0000 mg | PREFILLED_SYRINGE | Freq: Once | SUBCUTANEOUS | Status: AC
  Administered 2017-12-14: 60 mg via SUBCUTANEOUS

## 2017-12-14 MED ORDER — DENOSUMAB 60 MG/ML ~~LOC~~ SOSY
PREFILLED_SYRINGE | SUBCUTANEOUS | Status: AC
  Filled 2017-12-14: qty 1

## 2017-12-19 DIAGNOSIS — I129 Hypertensive chronic kidney disease with stage 1 through stage 4 chronic kidney disease, or unspecified chronic kidney disease: Secondary | ICD-10-CM | POA: Diagnosis not present

## 2018-02-02 DIAGNOSIS — R1032 Left lower quadrant pain: Secondary | ICD-10-CM | POA: Diagnosis not present

## 2018-02-25 ENCOUNTER — Other Ambulatory Visit: Payer: Self-pay | Admitting: Geriatric Medicine

## 2018-02-25 DIAGNOSIS — M79651 Pain in right thigh: Secondary | ICD-10-CM | POA: Diagnosis not present

## 2018-02-26 ENCOUNTER — Ambulatory Visit
Admission: RE | Admit: 2018-02-26 | Discharge: 2018-02-26 | Disposition: A | Discharge status: Home / Self Care | Payer: Medicare Other | Source: Ambulatory Visit | Attending: Geriatric Medicine | Admitting: Geriatric Medicine

## 2018-02-26 DIAGNOSIS — I82811 Embolism and thrombosis of superficial veins of right lower extremities: Secondary | ICD-10-CM | POA: Diagnosis not present

## 2018-02-26 DIAGNOSIS — M79651 Pain in right thigh: Secondary | ICD-10-CM

## 2018-04-17 ENCOUNTER — Other Ambulatory Visit: Payer: Self-pay | Admitting: Hematology and Oncology

## 2018-04-17 DIAGNOSIS — Z1231 Encounter for screening mammogram for malignant neoplasm of breast: Secondary | ICD-10-CM

## 2018-05-10 DIAGNOSIS — Z0000 Encounter for general adult medical examination without abnormal findings: Secondary | ICD-10-CM | POA: Diagnosis not present

## 2018-05-10 DIAGNOSIS — Z1389 Encounter for screening for other disorder: Secondary | ICD-10-CM | POA: Diagnosis not present

## 2018-05-10 DIAGNOSIS — E039 Hypothyroidism, unspecified: Secondary | ICD-10-CM | POA: Diagnosis not present

## 2018-05-10 DIAGNOSIS — Z79899 Other long term (current) drug therapy: Secondary | ICD-10-CM | POA: Diagnosis not present

## 2018-05-10 DIAGNOSIS — M810 Age-related osteoporosis without current pathological fracture: Secondary | ICD-10-CM | POA: Diagnosis not present

## 2018-05-10 DIAGNOSIS — L729 Follicular cyst of the skin and subcutaneous tissue, unspecified: Secondary | ICD-10-CM | POA: Diagnosis not present

## 2018-05-10 DIAGNOSIS — N183 Chronic kidney disease, stage 3 (moderate): Secondary | ICD-10-CM | POA: Diagnosis not present

## 2018-05-10 DIAGNOSIS — I129 Hypertensive chronic kidney disease with stage 1 through stage 4 chronic kidney disease, or unspecified chronic kidney disease: Secondary | ICD-10-CM | POA: Diagnosis not present

## 2018-05-16 DIAGNOSIS — Z79899 Other long term (current) drug therapy: Secondary | ICD-10-CM | POA: Diagnosis not present

## 2018-05-16 DIAGNOSIS — N183 Chronic kidney disease, stage 3 (moderate): Secondary | ICD-10-CM | POA: Diagnosis not present

## 2018-05-16 DIAGNOSIS — E039 Hypothyroidism, unspecified: Secondary | ICD-10-CM | POA: Diagnosis not present

## 2018-06-14 ENCOUNTER — Other Ambulatory Visit: Payer: Self-pay

## 2018-06-14 ENCOUNTER — Ambulatory Visit: Payer: Medicare Other

## 2018-06-14 ENCOUNTER — Other Ambulatory Visit: Payer: Medicare Other

## 2018-06-14 DIAGNOSIS — D0512 Intraductal carcinoma in situ of left breast: Secondary | ICD-10-CM

## 2018-06-17 ENCOUNTER — Inpatient Hospital Stay: Payer: Medicare Other

## 2018-06-17 ENCOUNTER — Inpatient Hospital Stay: Payer: Medicare Other | Attending: Hematology and Oncology

## 2018-06-17 ENCOUNTER — Ambulatory Visit
Admission: RE | Admit: 2018-06-17 | Discharge: 2018-06-17 | Disposition: A | Discharge status: Home / Self Care | Payer: Medicare Other | Source: Ambulatory Visit | Attending: Hematology and Oncology | Admitting: Hematology and Oncology

## 2018-06-17 ENCOUNTER — Other Ambulatory Visit: Payer: Self-pay

## 2018-06-17 ENCOUNTER — Other Ambulatory Visit: Payer: Self-pay | Admitting: Hematology and Oncology

## 2018-06-17 VITALS — BP 155/77 | HR 77 | Temp 98.1°F | Resp 18

## 2018-06-17 DIAGNOSIS — Z1231 Encounter for screening mammogram for malignant neoplasm of breast: Secondary | ICD-10-CM

## 2018-06-17 DIAGNOSIS — M810 Age-related osteoporosis without current pathological fracture: Secondary | ICD-10-CM

## 2018-06-17 DIAGNOSIS — Z86000 Personal history of in-situ neoplasm of breast: Secondary | ICD-10-CM | POA: Diagnosis not present

## 2018-06-17 DIAGNOSIS — D0512 Intraductal carcinoma in situ of left breast: Secondary | ICD-10-CM

## 2018-06-17 LAB — CMP (CANCER CENTER ONLY)
ALT: 17 U/L (ref 0–44)
AST: 15 U/L (ref 15–41)
Albumin: 4.1 g/dL (ref 3.5–5.0)
Alkaline Phosphatase: 67 U/L (ref 38–126)
Anion gap: 11 (ref 5–15)
BUN: 21 mg/dL (ref 8–23)
CO2: 23 mmol/L (ref 22–32)
Calcium: 9.5 mg/dL (ref 8.9–10.3)
Chloride: 103 mmol/L (ref 98–111)
Creatinine: 1.17 mg/dL — ABNORMAL HIGH (ref 0.44–1.00)
GFR, Est AFR Am: 52 mL/min — ABNORMAL LOW (ref >60)
GFR, Estimated: 45 mL/min — ABNORMAL LOW (ref >60)
Glucose, Bld: 123 mg/dL — ABNORMAL HIGH (ref 70–99)
Potassium: 3.9 mmol/L (ref 3.5–5.1)
Sodium: 137 mmol/L (ref 135–145)
Total Bilirubin: 0.3 mg/dL (ref 0.3–1.2)
Total Protein: 7.5 g/dL (ref 6.5–8.1)

## 2018-06-17 LAB — CBC WITH DIFFERENTIAL (CANCER CENTER ONLY)
Abs Immature Granulocytes: 0.03 10*3/uL (ref 0.00–0.07)
Basophils Absolute: 0.1 10*3/uL (ref 0.0–0.1)
Basophils Relative: 1 %
Eosinophils Absolute: 0.1 10*3/uL (ref 0.0–0.5)
Eosinophils Relative: 1 %
HCT: 41.5 % (ref 36.0–46.0)
Hemoglobin: 13.4 g/dL (ref 12.0–15.0)
Immature Granulocytes: 0 %
Lymphocytes Relative: 33 %
Lymphs Abs: 2.5 10*3/uL (ref 0.7–4.0)
MCH: 29.3 pg (ref 26.0–34.0)
MCHC: 32.3 g/dL (ref 30.0–36.0)
MCV: 90.6 fL (ref 80.0–100.0)
Monocytes Absolute: 0.6 10*3/uL (ref 0.1–1.0)
Monocytes Relative: 8 %
Neutro Abs: 4.5 10*3/uL (ref 1.7–7.7)
Neutrophils Relative %: 57 %
Platelet Count: 203 10*3/uL (ref 150–400)
RBC: 4.58 MIL/uL (ref 3.87–5.11)
RDW: 12.9 % (ref 11.5–15.5)
WBC Count: 7.8 10*3/uL (ref 4.0–10.5)
nRBC: 0 % (ref 0.0–0.2)

## 2018-06-17 MED ORDER — DENOSUMAB 60 MG/ML ~~LOC~~ SOSY
PREFILLED_SYRINGE | SUBCUTANEOUS | Status: AC
  Filled 2018-06-17: qty 1

## 2018-06-17 MED ORDER — DENOSUMAB 60 MG/ML ~~LOC~~ SOSY
60.0000 mg | PREFILLED_SYRINGE | Freq: Once | SUBCUTANEOUS | Status: AC
  Administered 2018-06-17: 60 mg via SUBCUTANEOUS

## 2018-06-17 NOTE — Patient Instructions (Signed)
Denosumab injection  What is this medicine?  DENOSUMAB (den oh sue mab) slows bone breakdown. Prolia is used to treat osteoporosis in women after menopause and in men. Xgeva is used to prevent bone fractures and other bone problems caused by cancer bone metastases. Xgeva is also used to treat giant cell tumor of the bone.  This medicine may be used for other purposes; ask your health care provider or pharmacist if you have questions.  What should I tell my health care provider before I take this medicine?  They need to know if you have any of these conditions:  -dental disease  -eczema  -infection or history of infections  -kidney disease or on dialysis  -low blood calcium or vitamin D  -malabsorption syndrome  -scheduled to have surgery or tooth extraction  -taking medicine that contains denosumab  -thyroid or parathyroid disease  -an unusual reaction to denosumab, other medicines, foods, dyes, or preservatives  -pregnant or trying to get pregnant  -breast-feeding  How should I use this medicine?  This medicine is for injection under the skin. It is given by a health care professional in a hospital or clinic setting.  If you are getting Prolia, a special MedGuide will be given to you by the pharmacist with each prescription and refill. Be sure to read this information carefully each time.  For Prolia, talk to your pediatrician regarding the use of this medicine in children. Special care may be needed. For Xgeva, talk to your pediatrician regarding the use of this medicine in children. While this drug may be prescribed for children as young as 13 years for selected conditions, precautions do apply.  Overdosage: If you think you have taken too much of this medicine contact a poison control center or emergency room at once.  NOTE: This medicine is only for you. Do not share this medicine with others.  What if I miss a dose?  It is important not to miss your dose. Call your doctor or health care professional if you are  unable to keep an appointment.  What may interact with this medicine?  Do not take this medicine with any of the following medications:  -other medicines containing denosumab  This medicine may also interact with the following medications:  -medicines that suppress the immune system  -medicines that treat cancer  -steroid medicines like prednisone or cortisone  This list may not describe all possible interactions. Give your health care provider a list of all the medicines, herbs, non-prescription drugs, or dietary supplements you use. Also tell them if you smoke, drink alcohol, or use illegal drugs. Some items may interact with your medicine.  What should I watch for while using this medicine?  Visit your doctor or health care professional for regular checks on your progress. Your doctor or health care professional may order blood tests and other tests to see how you are doing.  Call your doctor or health care professional if you get a cold or other infection while receiving this medicine. Do not treat yourself. This medicine may decrease your body's ability to fight infection.  You should make sure you get enough calcium and vitamin D while you are taking this medicine, unless your doctor tells you not to. Discuss the foods you eat and the vitamins you take with your health care professional.  See your dentist regularly. Brush and floss your teeth as directed. Before you have any dental work done, tell your dentist you are receiving this medicine.  Do   not become pregnant while taking this medicine or for 5 months after stopping it. Women should inform their doctor if they wish to become pregnant or think they might be pregnant. There is a potential for serious side effects to an unborn child. Talk to your health care professional or pharmacist for more information.  What side effects may I notice from receiving this medicine?  Side effects that you should report to your doctor or health care professional as soon as  possible:  -allergic reactions like skin rash, itching or hives, swelling of the face, lips, or tongue  -breathing problems  -chest pain  -fast, irregular heartbeat  -feeling faint or lightheaded, falls  -fever, chills, or any other sign of infection  -muscle spasms, tightening, or twitches  -numbness or tingling  -skin blisters or bumps, or is dry, peels, or red  -slow healing or unexplained pain in the mouth or jaw  -unusual bleeding or bruising  Side effects that usually do not require medical attention (Report these to your doctor or health care professional if they continue or are bothersome.):  -muscle pain  -stomach upset, gas  This list may not describe all possible side effects. Call your doctor for medical advice about side effects. You may report side effects to FDA at 1-800-FDA-1088.  Where should I keep my medicine?  This medicine is only given in a clinic, doctor's office, or other health care setting and will not be stored at home.  NOTE: This sheet is a summary. It may not cover all possible information. If you have questions about this medicine, talk to your doctor, pharmacist, or health care provider.      2016, [employer] Standard. (2011-06-19 12:37:47)

## 2018-06-21 ENCOUNTER — Encounter: Payer: Self-pay | Admitting: Hematology and Oncology

## 2018-07-23 ENCOUNTER — Ambulatory Visit
Admission: RE | Admit: 2018-07-23 | Discharge: 2018-07-23 | Disposition: A | Discharge status: Home / Self Care | Payer: Medicare Other | Source: Ambulatory Visit | Attending: Hematology and Oncology | Admitting: Hematology and Oncology

## 2018-07-23 ENCOUNTER — Other Ambulatory Visit: Payer: Self-pay

## 2018-07-23 DIAGNOSIS — M810 Age-related osteoporosis without current pathological fracture: Secondary | ICD-10-CM | POA: Diagnosis not present

## 2018-07-23 DIAGNOSIS — M8589 Other specified disorders of bone density and structure, multiple sites: Secondary | ICD-10-CM | POA: Diagnosis not present

## 2018-07-23 DIAGNOSIS — Z780 Asymptomatic menopausal state: Secondary | ICD-10-CM | POA: Diagnosis not present

## 2018-08-06 ENCOUNTER — Other Ambulatory Visit: Payer: Self-pay

## 2018-08-06 DIAGNOSIS — R6889 Other general symptoms and signs: Secondary | ICD-10-CM | POA: Diagnosis not present

## 2018-08-06 DIAGNOSIS — Z20822 Contact with and (suspected) exposure to covid-19: Secondary | ICD-10-CM

## 2018-08-07 LAB — NOVEL CORONAVIRUS, NAA: SARS-CoV-2, NAA: NOT DETECTED

## 2018-11-11 ENCOUNTER — Other Ambulatory Visit: Payer: Self-pay | Admitting: Geriatric Medicine

## 2018-11-11 DIAGNOSIS — N1831 Chronic kidney disease, stage 3a: Secondary | ICD-10-CM | POA: Diagnosis not present

## 2018-11-11 DIAGNOSIS — L720 Epidermal cyst: Secondary | ICD-10-CM | POA: Diagnosis not present

## 2018-11-11 DIAGNOSIS — I129 Hypertensive chronic kidney disease with stage 1 through stage 4 chronic kidney disease, or unspecified chronic kidney disease: Secondary | ICD-10-CM | POA: Diagnosis not present

## 2018-11-11 DIAGNOSIS — E039 Hypothyroidism, unspecified: Secondary | ICD-10-CM

## 2018-11-11 DIAGNOSIS — Z23 Encounter for immunization: Secondary | ICD-10-CM | POA: Diagnosis not present

## 2018-11-12 ENCOUNTER — Other Ambulatory Visit: Payer: Self-pay | Admitting: Geriatric Medicine

## 2018-11-12 DIAGNOSIS — R221 Localized swelling, mass and lump, neck: Secondary | ICD-10-CM

## 2018-11-18 ENCOUNTER — Ambulatory Visit
Admission: RE | Admit: 2018-11-18 | Discharge: 2018-11-18 | Disposition: A | Discharge status: Home / Self Care | Payer: Medicare Other | Source: Ambulatory Visit | Attending: Geriatric Medicine | Admitting: Geriatric Medicine

## 2018-11-18 DIAGNOSIS — R221 Localized swelling, mass and lump, neck: Secondary | ICD-10-CM | POA: Diagnosis not present

## 2018-11-25 ENCOUNTER — Other Ambulatory Visit: Payer: Self-pay | Admitting: Geriatric Medicine

## 2018-11-25 DIAGNOSIS — R221 Localized swelling, mass and lump, neck: Secondary | ICD-10-CM

## 2018-12-06 ENCOUNTER — Ambulatory Visit
Admission: RE | Admit: 2018-12-06 | Discharge: 2018-12-06 | Disposition: A | Discharge status: Home / Self Care | Payer: Medicare Other | Source: Ambulatory Visit | Attending: Geriatric Medicine | Admitting: Geriatric Medicine

## 2018-12-06 DIAGNOSIS — C760 Malignant neoplasm of head, face and neck: Secondary | ICD-10-CM | POA: Diagnosis not present

## 2018-12-06 DIAGNOSIS — R221 Localized swelling, mass and lump, neck: Secondary | ICD-10-CM

## 2018-12-06 MED ORDER — IOPAMIDOL (ISOVUE-300) INJECTION 61%
75.0000 mL | Freq: Once | INTRAVENOUS | Status: AC | PRN
  Administered 2018-12-06: 10:00:00 75 mL via INTRAVENOUS

## 2018-12-12 ENCOUNTER — Other Ambulatory Visit: Payer: Self-pay | Admitting: *Deleted

## 2018-12-12 DIAGNOSIS — D0512 Intraductal carcinoma in situ of left breast: Secondary | ICD-10-CM

## 2018-12-12 NOTE — Progress Notes (Signed)
Patient Care Team: Lajean Manes, MD as PCP - General (Internal Medicine)  DIAGNOSIS:    ICD-10-CM   1. Neoplasm of left breast, primary tumor staging category Tis: ductal carcinoma in situ (DCIS)  D05.12     SUMMARY OF ONCOLOGIC HISTORY: Oncology History  Neoplasm of left breast, primary tumor staging category Tis: ductal carcinoma in situ (DCIS)  06/24/2012 Surgery   Left breast lumpectomy and sentinel lymph node biopsy DCIS   08/30/2012 - 09/28/2012 Radiation Therapy   Radiation to lumpectomy site   10/02/2012 - 10/02/2013 Anti-estrogen oral therapy   Tamoxifen 20 mg daily started 10/02/2012 discontinued due to superficial leg vein thrombosis. Aromasin 25 mg started July 2015 stopped for muscle aches, restarted 08/28/2013 stopped in 10/02/13 due to elevation AST adn ALT   10/02/2012 - 12/2017 Anti-estrogen oral therapy   Arimidex 1 mg daily   12/23/2015 Miscellaneous   Genetic testing: Negative for any mutations of 20 genes on the breast/ovarian cancer panel through [facility]     CHIEF COMPLIANT: Follow-up of DCIS on surveillance   INTERVAL HISTORY: Laurie Gomez is a 78 y.o. with above-mentioned history of left breast DCIS treated with lumpectomy, radiation, completed 5 years of antiestrogen therapy, and is currently on surveillance. Mammogram on 06/17/18 showed no evidence of malignancy bilaterally. Bone density scan on 07/23/18 showed osteoporosis with a T-score of -2.6 at the right femur neck. She presents to the clinic today for annual follow-up.   REVIEW OF SYSTEMS:   Constitutional: Denies fevers, chills or abnormal weight loss Eyes: Denies blurriness of vision Ears, nose, mouth, throat, and face: Denies mucositis or sore throat Respiratory: Denies cough, dyspnea or wheezes Cardiovascular: Denies palpitation, chest discomfort Gastrointestinal: Denies nausea, heartburn or change in bowel habits Skin: Denies abnormal skin rashes Lymphatics: Denies new  lymphadenopathy or easy bruising Neurological: Denies numbness, tingling or new weaknesses Behavioral/Psych: Mood is stable, no new changes  Extremities: No lower extremity edema Breast: denies any pain or lumps or nodules in either breasts All other systems were reviewed with the patient and are negative.  I have reviewed the past medical history, past surgical history, social history and family history with the patient and they are unchanged from previous note.  ALLERGIES:  has No Known Allergies.  MEDICATIONS:  Current Outpatient Medications  Medication Sig Dispense Refill  . amlodipine-benazepril (LOTREL) 2.5-10 MG per capsule   3  . levothyroxine (SYNTHROID) 75 MCG tablet Take 75 mcg by mouth daily before breakfast.     . Multiple Vitamin (MULTIVITAMIN) tablet Take 1 tablet by mouth daily.     . Psyllium (WAL-MUCIL PO) Take 2 tablets by mouth daily.    Marland Kitchen saccharomyces boulardii (FLORASTOR) 250 MG capsule Take 1 capsule (250 mg total) by mouth 2 (two) times daily.    Marland Kitchen triamterene-hydrochlorothiazide (MAXZIDE) 75-50 MG per tablet Take 1 tablet by mouth daily. Take 1/2 tablet daily.     No current facility-administered medications for this visit.    PHYSICAL EXAMINATION: ECOG PERFORMANCE STATUS: 1 - Symptomatic but completely ambulatory  Vitals:   12/13/18 0829  BP: 138/60  Pulse: 63  Resp: 17  Temp: 97.6 F (36.4 C)  SpO2: 100%   Filed Weights   12/13/18 0829  Weight: 173 lb 9.6 oz (78.7 kg)    GENERAL: alert, no distress and comfortable SKIN: skin color, texture, turgor are normal, no rashes or significant lesions EYES: normal, Conjunctiva are pink and non-injected, sclera clear OROPHARYNX: no exudate, no erythema and lips,  buccal mucosa, and tongue normal  NECK: supple, thyroid normal size, non-tender, without nodularity LYMPH: no palpable lymphadenopathy in the cervical, axillary or inguinal LUNGS: clear to auscultation and percussion with normal breathing  effort HEART: regular rate & rhythm and no murmurs and no lower extremity edema ABDOMEN: abdomen soft, non-tender and normal bowel sounds MUSCULOSKELETAL: no cyanosis of digits and no clubbing  NEURO: alert & oriented x 3 with fluent speech, no focal motor/sensory deficits EXTREMITIES: No lower extremity edema BREAST: No palpable masses or nodules in either right or left breasts. No palpable axillary supraclavicular or infraclavicular adenopathy no breast tenderness or nipple discharge. (exam performed in the presence of a chaperone)  LABORATORY DATA:  I have reviewed the data as listed CMP Latest Ref Rng & Units 06/17/2018 12/14/2017 06/08/2017  Glucose 70 - 99 mg/dL 123(H) 80 113  BUN 8 - 23 mg/dL 21 22 18   Creatinine 0.44 - 1.00 mg/dL 1.17(H) 1.06(H) 1.16(H)  Sodium 135 - 145 mmol/L 137 141 138  Potassium 3.5 - 5.1 mmol/L 3.9 4.0 3.9  Chloride 98 - 111 mmol/L 103 105 104  CO2 22 - 32 mmol/L 23 26 23   Calcium 8.9 - 10.3 mg/dL 9.5 9.3 9.4  Total Protein 6.5 - 8.1 g/dL 7.5 6.8 7.4  Total Bilirubin 0.3 - 1.2 mg/dL 0.3 0.4 0.4  Alkaline Phos 38 - 126 U/L 67 62 74  AST 15 - 41 U/L 15 14(L) 16  ALT 0 - 44 U/L 17 14 14     Lab Results  Component Value Date   WBC 7.1 12/13/2018   HGB 13.2 12/13/2018   HCT 39.7 12/13/2018   MCV 91.3 12/13/2018   PLT 184 12/13/2018   NEUTROABS 3.5 12/13/2018    ASSESSMENT & PLAN:  Neoplasm of left breast, primary tumor staging category Tis: ductal carcinoma in situ (DCIS) DCIS left breaststatus post lumpectomy and radiation, could not tolerate tamoxifen because of superficial venous thrombosis (started 10/02/2012) currently on Arimidex since 10/02/2013.  Completed the December 2019    Breast Cancer Surveillance: 1. Breast exam : 12/13/2018 benign  . 2. Mammogram  06/18/2018 no abnormalities. Postsurgical changes. Breast Density Category B. 3. Bone density 07/23/2018: OsteoporosisT score at -2.6 On Prolia every 6 months started June 2017  CT neck  12/06/2018: No neck mass or adenopathy    Return to clinic in 1 year for follow-up      No orders of the defined types were placed in this encounter.  The patient has a good understanding of the overall plan. she agrees with it. she will call with any problems that may develop before the next visit here.  Nicholas Lose, MD 12/13/2018  Julious Oka Dorshimer, am acting as scribe for Dr. Nicholas Lose.  I have reviewed the above document for accuracy and completeness, and I agree with the above.

## 2018-12-13 ENCOUNTER — Inpatient Hospital Stay: Payer: Medicare Other | Attending: Hematology and Oncology

## 2018-12-13 ENCOUNTER — Inpatient Hospital Stay (HOSPITAL_BASED_OUTPATIENT_CLINIC_OR_DEPARTMENT_OTHER): Payer: Medicare Other | Admitting: Hematology and Oncology

## 2018-12-13 ENCOUNTER — Inpatient Hospital Stay: Payer: Medicare Other

## 2018-12-13 ENCOUNTER — Ambulatory Visit: Payer: Medicare Other

## 2018-12-13 ENCOUNTER — Other Ambulatory Visit: Payer: Self-pay

## 2018-12-13 VITALS — BP 128/72 | HR 68 | Temp 98.2°F | Resp 18

## 2018-12-13 DIAGNOSIS — M810 Age-related osteoporosis without current pathological fracture: Secondary | ICD-10-CM | POA: Insufficient documentation

## 2018-12-13 DIAGNOSIS — Z86000 Personal history of in-situ neoplasm of breast: Secondary | ICD-10-CM | POA: Insufficient documentation

## 2018-12-13 DIAGNOSIS — D0512 Intraductal carcinoma in situ of left breast: Secondary | ICD-10-CM | POA: Diagnosis not present

## 2018-12-13 LAB — CBC WITH DIFFERENTIAL (CANCER CENTER ONLY)
Abs Immature Granulocytes: 0.03 10*3/uL (ref 0.00–0.07)
Basophils Absolute: 0.1 10*3/uL (ref 0.0–0.1)
Basophils Relative: 1 %
Eosinophils Absolute: 0.1 10*3/uL (ref 0.0–0.5)
Eosinophils Relative: 2 %
HCT: 39.7 % (ref 36.0–46.0)
Hemoglobin: 13.2 g/dL (ref 12.0–15.0)
Immature Granulocytes: 0 %
Lymphocytes Relative: 36 %
Lymphs Abs: 2.6 10*3/uL (ref 0.7–4.0)
MCH: 30.3 pg (ref 26.0–34.0)
MCHC: 33.2 g/dL (ref 30.0–36.0)
MCV: 91.3 fL (ref 80.0–100.0)
Monocytes Absolute: 0.9 10*3/uL (ref 0.1–1.0)
Monocytes Relative: 12 %
Neutro Abs: 3.5 10*3/uL (ref 1.7–7.7)
Neutrophils Relative %: 49 %
Platelet Count: 184 10*3/uL (ref 150–400)
RBC: 4.35 MIL/uL (ref 3.87–5.11)
RDW: 13 % (ref 11.5–15.5)
WBC Count: 7.1 10*3/uL (ref 4.0–10.5)
nRBC: 0 % (ref 0.0–0.2)

## 2018-12-13 LAB — CMP (CANCER CENTER ONLY)
ALT: 16 U/L (ref 0–44)
AST: 15 U/L (ref 15–41)
Albumin: 3.9 g/dL (ref 3.5–5.0)
Alkaline Phosphatase: 56 U/L (ref 38–126)
Anion gap: 8 (ref 5–15)
BUN: 22 mg/dL (ref 8–23)
CO2: 28 mmol/L (ref 22–32)
Calcium: 9.1 mg/dL (ref 8.9–10.3)
Chloride: 104 mmol/L (ref 98–111)
Creatinine: 1.07 mg/dL — ABNORMAL HIGH (ref 0.44–1.00)
GFR, Est AFR Am: 58 mL/min — ABNORMAL LOW (ref >60)
GFR, Estimated: 50 mL/min — ABNORMAL LOW (ref >60)
Glucose, Bld: 97 mg/dL (ref 70–99)
Potassium: 3.9 mmol/L (ref 3.5–5.1)
Sodium: 140 mmol/L (ref 135–145)
Total Bilirubin: 0.5 mg/dL (ref 0.3–1.2)
Total Protein: 6.9 g/dL (ref 6.5–8.1)

## 2018-12-13 MED ORDER — DENOSUMAB 60 MG/ML ~~LOC~~ SOSY
PREFILLED_SYRINGE | SUBCUTANEOUS | Status: AC
  Filled 2018-12-13: qty 1

## 2018-12-13 MED ORDER — DENOSUMAB 60 MG/ML ~~LOC~~ SOSY
60.0000 mg | PREFILLED_SYRINGE | Freq: Once | SUBCUTANEOUS | Status: AC
  Administered 2018-12-13: 60 mg via SUBCUTANEOUS

## 2018-12-13 NOTE — Assessment & Plan Note (Signed)
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  Elevated ALT and ASTand alkaline phosphatase: Ultrasound of the liver showed fatty liver. These abnormal blood test had normalized after stopping Aromasin and switching her to Arimidex. Her primary care physician is monitoring her liver.  She has now completed 5 years of antiestrogen therapy.  This will be discontinued at this time.  Breast Cancer Surveillance: 1. Breast exam : 12/13/2018 benign  . 2. Mammogram  06/18/2018 no abnormalities. Postsurgical changes. Breast Density Category B. 3. Bone density 07/23/2018: OsteoporosisT score at -2.6 On Prolia every 6 months started June 2017  CT neck 12/06/2018: No neck mass or adenopathy  Anastrozole toxicities: 1. Muscle aches and pains  Return to clinic in 1 year for follow-up with long-term survivorship

## 2018-12-13 NOTE — Patient Instructions (Signed)
Denosumab injection  What is this medicine?  DENOSUMAB (den oh sue mab) slows bone breakdown. Prolia is used to treat osteoporosis in women after menopause and in men. Xgeva is used to prevent bone fractures and other bone problems caused by cancer bone metastases. Xgeva is also used to treat giant cell tumor of the bone.  This medicine may be used for other purposes; ask your health care provider or pharmacist if you have questions.  What should I tell my health care provider before I take this medicine?  They need to know if you have any of these conditions:  -dental disease  -eczema  -infection or history of infections  -kidney disease or on dialysis  -low blood calcium or vitamin D  -malabsorption syndrome  -scheduled to have surgery or tooth extraction  -taking medicine that contains denosumab  -thyroid or parathyroid disease  -an unusual reaction to denosumab, other medicines, foods, dyes, or preservatives  -pregnant or trying to get pregnant  -breast-feeding  How should I use this medicine?  This medicine is for injection under the skin. It is given by a health care professional in a hospital or clinic setting.  If you are getting Prolia, a special MedGuide will be given to you by the pharmacist with each prescription and refill. Be sure to read this information carefully each time.  For Prolia, talk to your pediatrician regarding the use of this medicine in children. Special care may be needed. For Xgeva, talk to your pediatrician regarding the use of this medicine in children. While this drug may be prescribed for children as young as 13 years for selected conditions, precautions do apply.  Overdosage: If you think you have taken too much of this medicine contact a poison control center or emergency room at once.  NOTE: This medicine is only for you. Do not share this medicine with others.  What if I miss a dose?  It is important not to miss your dose. Call your doctor or health care professional if you are  unable to keep an appointment.  What may interact with this medicine?  Do not take this medicine with any of the following medications:  -other medicines containing denosumab  This medicine may also interact with the following medications:  -medicines that suppress the immune system  -medicines that treat cancer  -steroid medicines like prednisone or cortisone  This list may not describe all possible interactions. Give your health care provider a list of all the medicines, herbs, non-prescription drugs, or dietary supplements you use. Also tell them if you smoke, drink alcohol, or use illegal drugs. Some items may interact with your medicine.  What should I watch for while using this medicine?  Visit your doctor or health care professional for regular checks on your progress. Your doctor or health care professional may order blood tests and other tests to see how you are doing.  Call your doctor or health care professional if you get a cold or other infection while receiving this medicine. Do not treat yourself. This medicine may decrease your body's ability to fight infection.  You should make sure you get enough calcium and vitamin D while you are taking this medicine, unless your doctor tells you not to. Discuss the foods you eat and the vitamins you take with your health care professional.  See your dentist regularly. Brush and floss your teeth as directed. Before you have any dental work done, tell your dentist you are receiving this medicine.  Do   not become pregnant while taking this medicine or for 5 months after stopping it. Women should inform their doctor if they wish to become pregnant or think they might be pregnant. There is a potential for serious side effects to an unborn child. Talk to your health care professional or pharmacist for more information.  What side effects may I notice from receiving this medicine?  Side effects that you should report to your doctor or health care professional as soon as  possible:  -allergic reactions like skin rash, itching or hives, swelling of the face, lips, or tongue  -breathing problems  -chest pain  -fast, irregular heartbeat  -feeling faint or lightheaded, falls  -fever, chills, or any other sign of infection  -muscle spasms, tightening, or twitches  -numbness or tingling  -skin blisters or bumps, or is dry, peels, or red  -slow healing or unexplained pain in the mouth or jaw  -unusual bleeding or bruising  Side effects that usually do not require medical attention (Report these to your doctor or health care professional if they continue or are bothersome.):  -muscle pain  -stomach upset, gas  This list may not describe all possible side effects. Call your doctor for medical advice about side effects. You may report side effects to FDA at 1-800-FDA-1088.  Where should I keep my medicine?  This medicine is only given in a clinic, doctor's office, or other health care setting and will not be stored at home.  NOTE: This sheet is a summary. It may not cover all possible information. If you have questions about this medicine, talk to your doctor, pharmacist, or health care provider.      2016, [employer] Standard. (2011-06-19 12:37:47)

## 2018-12-16 ENCOUNTER — Telehealth: Payer: Self-pay | Admitting: Hematology and Oncology

## 2018-12-16 NOTE — Telephone Encounter (Signed)
I left a message regarding schedule  

## 2019-02-15 DIAGNOSIS — Z23 Encounter for immunization: Secondary | ICD-10-CM | POA: Diagnosis not present

## 2019-03-15 DIAGNOSIS — Z23 Encounter for immunization: Secondary | ICD-10-CM | POA: Diagnosis not present

## 2019-05-13 DIAGNOSIS — Z0000 Encounter for general adult medical examination without abnormal findings: Secondary | ICD-10-CM | POA: Diagnosis not present

## 2019-05-13 DIAGNOSIS — Z79899 Other long term (current) drug therapy: Secondary | ICD-10-CM | POA: Diagnosis not present

## 2019-05-13 DIAGNOSIS — Z1159 Encounter for screening for other viral diseases: Secondary | ICD-10-CM | POA: Diagnosis not present

## 2019-05-13 DIAGNOSIS — Z1389 Encounter for screening for other disorder: Secondary | ICD-10-CM | POA: Diagnosis not present

## 2019-05-13 DIAGNOSIS — E039 Hypothyroidism, unspecified: Secondary | ICD-10-CM | POA: Diagnosis not present

## 2019-05-13 DIAGNOSIS — I129 Hypertensive chronic kidney disease with stage 1 through stage 4 chronic kidney disease, or unspecified chronic kidney disease: Secondary | ICD-10-CM | POA: Diagnosis not present

## 2019-05-13 DIAGNOSIS — Z23 Encounter for immunization: Secondary | ICD-10-CM | POA: Diagnosis not present

## 2019-05-13 DIAGNOSIS — N1831 Chronic kidney disease, stage 3a: Secondary | ICD-10-CM | POA: Diagnosis not present

## 2019-05-16 ENCOUNTER — Other Ambulatory Visit: Payer: Self-pay | Admitting: Hematology and Oncology

## 2019-05-16 DIAGNOSIS — Z1231 Encounter for screening mammogram for malignant neoplasm of breast: Secondary | ICD-10-CM

## 2019-06-12 ENCOUNTER — Other Ambulatory Visit: Payer: Self-pay | Admitting: *Deleted

## 2019-06-12 DIAGNOSIS — D0512 Intraductal carcinoma in situ of left breast: Secondary | ICD-10-CM

## 2019-06-13 ENCOUNTER — Other Ambulatory Visit: Payer: Medicare Other

## 2019-06-13 ENCOUNTER — Inpatient Hospital Stay: Payer: Medicare Other | Attending: Hematology and Oncology

## 2019-06-13 ENCOUNTER — Inpatient Hospital Stay: Payer: Medicare Other

## 2019-06-13 DIAGNOSIS — Z86000 Personal history of in-situ neoplasm of breast: Secondary | ICD-10-CM | POA: Insufficient documentation

## 2019-06-13 DIAGNOSIS — M810 Age-related osteoporosis without current pathological fracture: Secondary | ICD-10-CM | POA: Insufficient documentation

## 2019-06-16 ENCOUNTER — Telehealth: Payer: Self-pay | Admitting: Hematology and Oncology

## 2019-06-16 ENCOUNTER — Encounter: Payer: Self-pay | Admitting: Hematology and Oncology

## 2019-06-16 NOTE — Telephone Encounter (Signed)
Scheduled appt per 6/14 sch message - pt is aware of new appt date and time .

## 2019-06-18 ENCOUNTER — Other Ambulatory Visit: Payer: Self-pay

## 2019-06-18 ENCOUNTER — Inpatient Hospital Stay: Payer: Medicare Other

## 2019-06-18 VITALS — BP 138/58 | Temp 98.2°F | Resp 18

## 2019-06-18 DIAGNOSIS — M810 Age-related osteoporosis without current pathological fracture: Secondary | ICD-10-CM

## 2019-06-18 DIAGNOSIS — D0512 Intraductal carcinoma in situ of left breast: Secondary | ICD-10-CM

## 2019-06-18 DIAGNOSIS — Z86000 Personal history of in-situ neoplasm of breast: Secondary | ICD-10-CM | POA: Diagnosis not present

## 2019-06-18 LAB — CMP (CANCER CENTER ONLY)
ALT: 15 U/L (ref 0–44)
AST: 13 U/L — ABNORMAL LOW (ref 15–41)
Albumin: 3.9 g/dL (ref 3.5–5.0)
Alkaline Phosphatase: 63 U/L (ref 38–126)
Anion gap: 10 (ref 5–15)
BUN: 17 mg/dL (ref 8–23)
CO2: 26 mmol/L (ref 22–32)
Calcium: 9.7 mg/dL (ref 8.9–10.3)
Chloride: 104 mmol/L (ref 98–111)
Creatinine: 1.14 mg/dL — ABNORMAL HIGH (ref 0.44–1.00)
GFR, Est AFR Am: 54 mL/min — ABNORMAL LOW (ref >60)
GFR, Estimated: 46 mL/min — ABNORMAL LOW (ref >60)
Glucose, Bld: 92 mg/dL (ref 70–99)
Potassium: 4.3 mmol/L (ref 3.5–5.1)
Sodium: 140 mmol/L (ref 135–145)
Total Bilirubin: 0.5 mg/dL (ref 0.3–1.2)
Total Protein: 6.9 g/dL (ref 6.5–8.1)

## 2019-06-18 LAB — CBC WITH DIFFERENTIAL (CANCER CENTER ONLY)
Abs Immature Granulocytes: 0.02 10*3/uL (ref 0.00–0.07)
Basophils Absolute: 0.1 10*3/uL (ref 0.0–0.1)
Basophils Relative: 1 %
Eosinophils Absolute: 0.1 10*3/uL (ref 0.0–0.5)
Eosinophils Relative: 2 %
HCT: 40.5 % (ref 36.0–46.0)
Hemoglobin: 13.3 g/dL (ref 12.0–15.0)
Immature Granulocytes: 0 %
Lymphocytes Relative: 34 %
Lymphs Abs: 2.5 10*3/uL (ref 0.7–4.0)
MCH: 29.6 pg (ref 26.0–34.0)
MCHC: 32.8 g/dL (ref 30.0–36.0)
MCV: 90.2 fL (ref 80.0–100.0)
Monocytes Absolute: 0.9 10*3/uL (ref 0.1–1.0)
Monocytes Relative: 12 %
Neutro Abs: 3.9 10*3/uL (ref 1.7–7.7)
Neutrophils Relative %: 51 %
Platelet Count: 196 10*3/uL (ref 150–400)
RBC: 4.49 MIL/uL (ref 3.87–5.11)
RDW: 12.9 % (ref 11.5–15.5)
WBC Count: 7.5 10*3/uL (ref 4.0–10.5)
nRBC: 0 % (ref 0.0–0.2)

## 2019-06-18 MED ORDER — DENOSUMAB 60 MG/ML ~~LOC~~ SOSY
PREFILLED_SYRINGE | SUBCUTANEOUS | Status: AC
  Filled 2019-06-18: qty 1

## 2019-06-18 MED ORDER — DENOSUMAB 60 MG/ML ~~LOC~~ SOSY
60.0000 mg | PREFILLED_SYRINGE | Freq: Once | SUBCUTANEOUS | Status: AC
  Administered 2019-06-18: 60 mg via SUBCUTANEOUS

## 2019-06-18 NOTE — Patient Instructions (Signed)
Denosumab injection What is this medicine? DENOSUMAB (den oh sue mab) slows bone breakdown. Prolia is used to treat osteoporosis in women after menopause and in men, and in people who are taking corticosteroids for 6 months or more. Xgeva is used to treat a high calcium level due to cancer and to prevent bone fractures and other bone problems caused by multiple myeloma or cancer bone metastases. Xgeva is also used to treat giant cell tumor of the bone. This medicine may be used for other purposes; ask your health care provider or pharmacist if you have questions. COMMON BRAND NAME(S): Prolia, XGEVA What should I tell my health care provider before I take this medicine? They need to know if you have any of these conditions:  dental disease  having surgery or tooth extraction  infection  kidney disease  low levels of calcium or Vitamin D in the blood  malnutrition  on hemodialysis  skin conditions or sensitivity  thyroid or parathyroid disease  an unusual reaction to denosumab, other medicines, foods, dyes, or preservatives  pregnant or trying to get pregnant  breast-feeding How should I use this medicine? This medicine is for injection under the skin. It is given by a health care professional in a hospital or clinic setting. A special MedGuide will be given to you before each treatment. Be sure to read this information carefully each time. For Prolia, talk to your pediatrician regarding the use of this medicine in children. Special care may be needed. For Xgeva, talk to your pediatrician regarding the use of this medicine in children. While this drug may be prescribed for children as young as 13 years for selected conditions, precautions do apply. Overdosage: If you think you have taken too much of this medicine contact a poison control center or emergency room at once. NOTE: This medicine is only for you. Do not share this medicine with others. What if I miss a dose? It is  important not to miss your dose. Call your doctor or health care professional if you are unable to keep an appointment. What may interact with this medicine? Do not take this medicine with any of the following medications:  other medicines containing denosumab This medicine may also interact with the following medications:  medicines that lower your chance of fighting infection  steroid medicines like prednisone or cortisone This list may not describe all possible interactions. Give your health care provider a list of all the medicines, herbs, non-prescription drugs, or dietary supplements you use. Also tell them if you smoke, drink alcohol, or use illegal drugs. Some items may interact with your medicine. What should I watch for while using this medicine? Visit your doctor or health care professional for regular checks on your progress. Your doctor or health care professional may order blood tests and other tests to see how you are doing. Call your doctor or health care professional for advice if you get a fever, chills or sore throat, or other symptoms of a cold or flu. Do not treat yourself. This drug may decrease your body's ability to fight infection. Try to avoid being around people who are sick. You should make sure you get enough calcium and vitamin D while you are taking this medicine, unless your doctor tells you not to. Discuss the foods you eat and the vitamins you take with your health care professional. See your dentist regularly. Brush and floss your teeth as directed. Before you have any dental work done, tell your dentist you are   receiving this medicine. Do not become pregnant while taking this medicine or for 5 months after stopping it. Talk with your doctor or health care professional about your birth control options while taking this medicine. Women should inform their doctor if they wish to become pregnant or think they might be pregnant. There is a potential for serious side  effects to an unborn child. Talk to your health care professional or pharmacist for more information. What side effects may I notice from receiving this medicine? Side effects that you should report to your doctor or health care professional as soon as possible:  allergic reactions like skin rash, itching or hives, swelling of the face, lips, or tongue  bone pain  breathing problems  dizziness  jaw pain, especially after dental work  redness, blistering, peeling of the skin  signs and symptoms of infection like fever or chills; cough; sore throat; pain or trouble passing urine  signs of low calcium like fast heartbeat, muscle cramps or muscle pain; pain, tingling, numbness in the hands or feet; seizures  unusual bleeding or bruising  unusually weak or tired Side effects that usually do not require medical attention (report to your doctor or health care professional if they continue or are bothersome):  constipation  diarrhea  headache  joint pain  loss of appetite  muscle pain  runny nose  tiredness  upset stomach This list may not describe all possible side effects. Call your doctor for medical advice about side effects. You may report side effects to FDA at 1-800-FDA-1088. Where should I keep my medicine? This medicine is only given in a clinic, doctor's office, or other health care setting and will not be stored at home. NOTE: This sheet is a summary. It may not cover all possible information. If you have questions about this medicine, talk to your doctor, pharmacist, or health care provider.  2020 Elsevier/Gold Standard (2017-04-27 16:10:44)

## 2019-06-20 ENCOUNTER — Ambulatory Visit
Admission: RE | Admit: 2019-06-20 | Discharge: 2019-06-20 | Disposition: A | Discharge status: Home / Self Care | Payer: Medicare Other | Source: Ambulatory Visit | Attending: Hematology and Oncology | Admitting: Hematology and Oncology

## 2019-06-20 ENCOUNTER — Other Ambulatory Visit: Payer: Self-pay

## 2019-06-20 DIAGNOSIS — Z1231 Encounter for screening mammogram for malignant neoplasm of breast: Secondary | ICD-10-CM

## 2019-11-10 DIAGNOSIS — N1831 Chronic kidney disease, stage 3a: Secondary | ICD-10-CM | POA: Diagnosis not present

## 2019-11-10 DIAGNOSIS — I129 Hypertensive chronic kidney disease with stage 1 through stage 4 chronic kidney disease, or unspecified chronic kidney disease: Secondary | ICD-10-CM | POA: Diagnosis not present

## 2019-11-10 DIAGNOSIS — Z23 Encounter for immunization: Secondary | ICD-10-CM | POA: Diagnosis not present

## 2019-11-10 DIAGNOSIS — E039 Hypothyroidism, unspecified: Secondary | ICD-10-CM | POA: Diagnosis not present

## 2019-12-01 IMAGING — US US SOFT TISSUE HEAD/NECK
1 series · 12 of 12 positions shown · non-contrast
Comparison: 07/25/2016

CLINICAL DATA: 78-year-old female with a history of anterior neck
swelling.

Given history of prior thyroid surgery.
EXAM:
ULTRASOUND OF HEAD/NECK SOFT TISSUES
TECHNIQUE: Ultrasound examination of the head and neck soft tissues was
performed in the area of clinical concern.

[Series 1: us soft tissue head/neck · 0.05mm/px · 12 acquisitions, 12 frames shown]
[im 1/12]
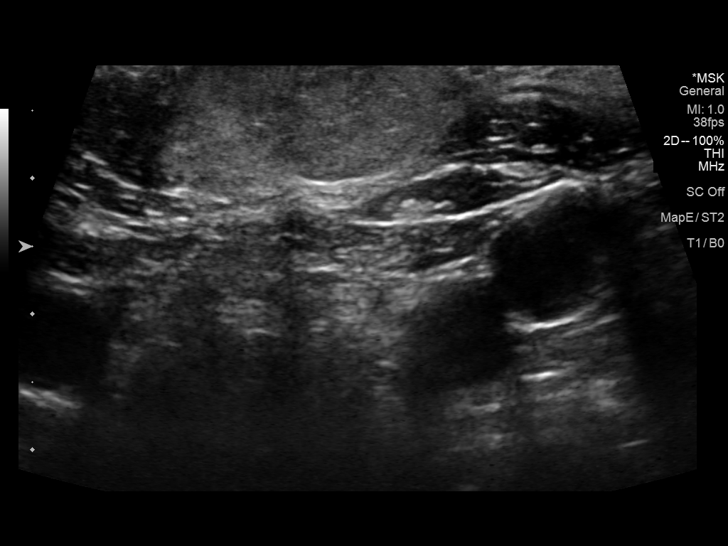
[im 2/12]
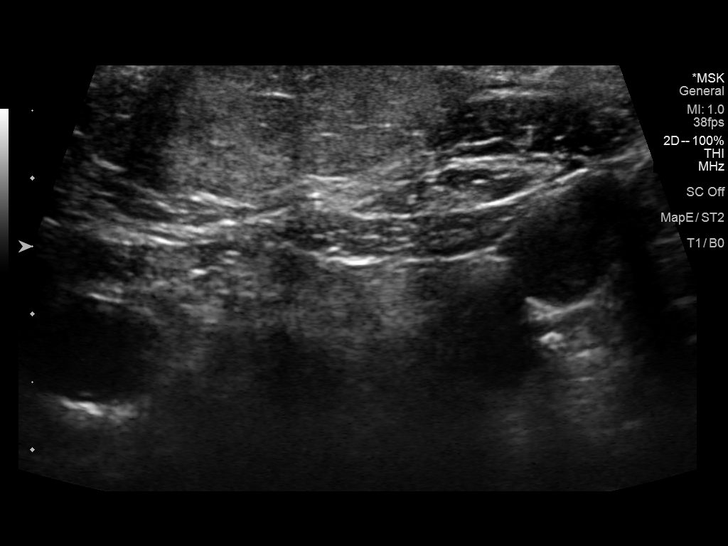
[im 3/12]
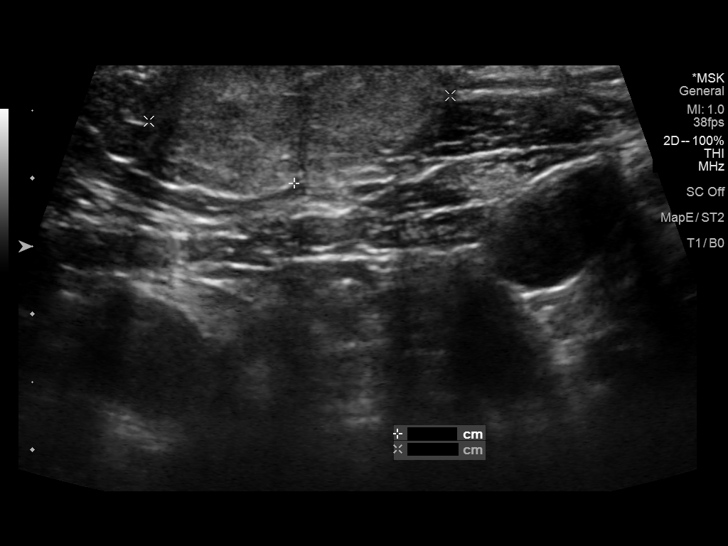
[im 4/12]
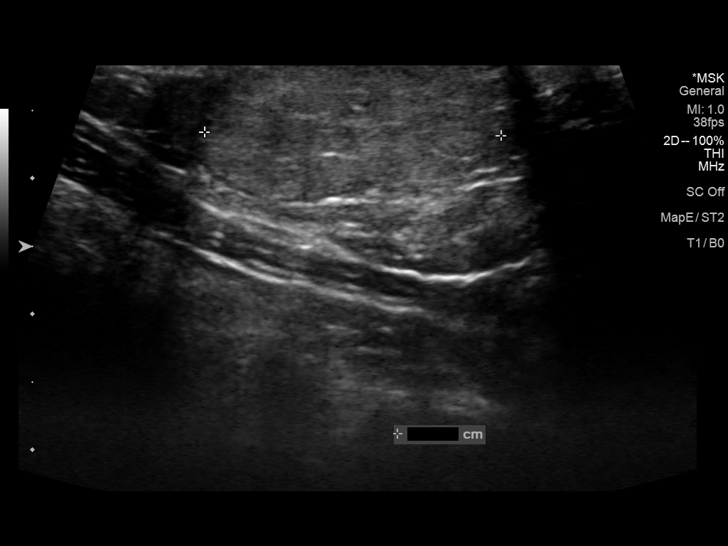
[im 5/12]
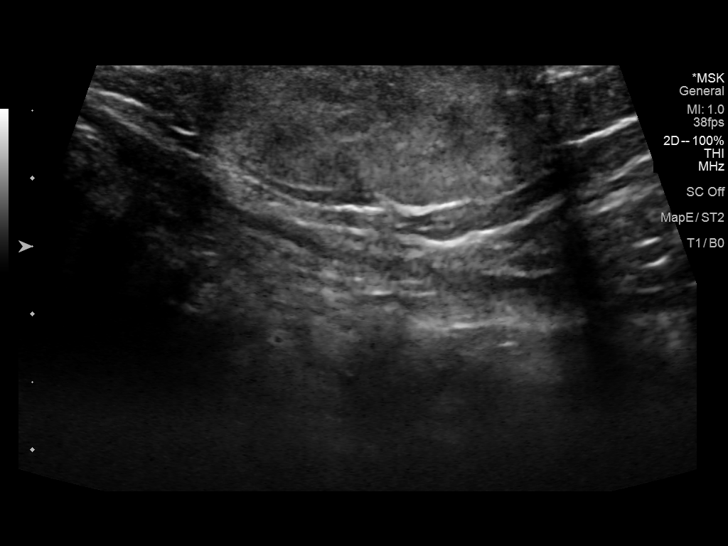
[im 6/12]
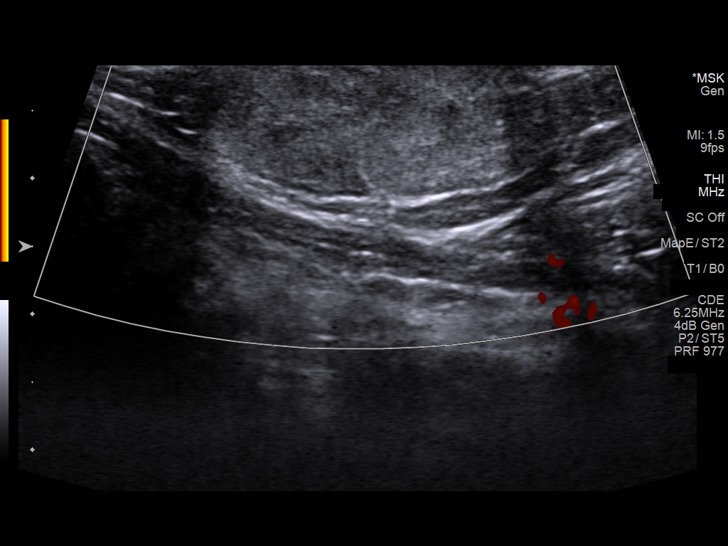
[im 7/12]
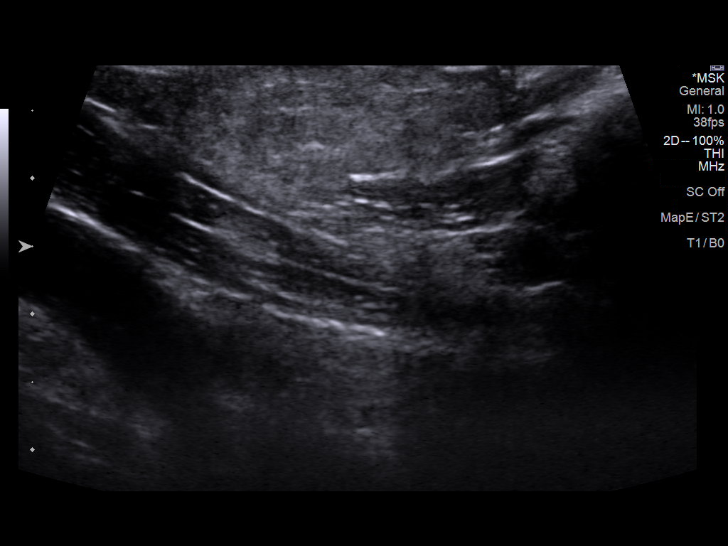
[im 8/12]
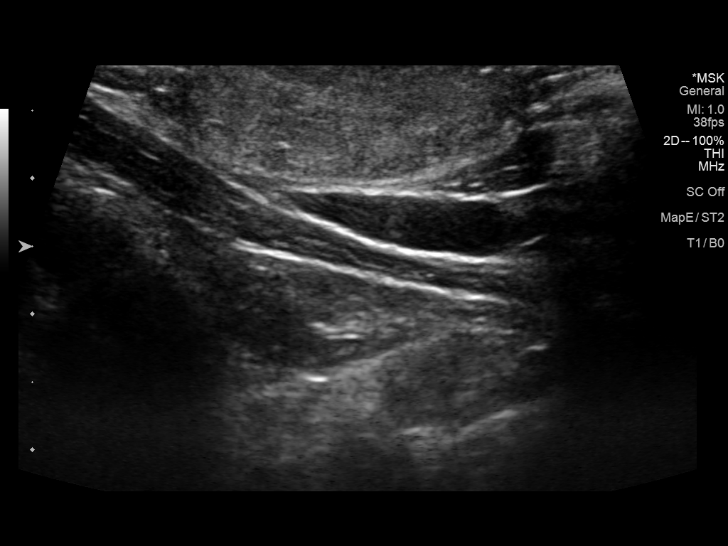
[im 9/12]
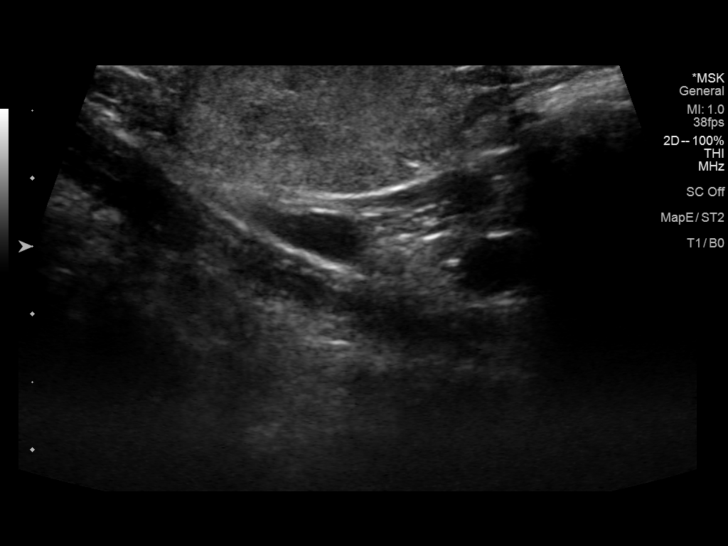
[im 10/12]
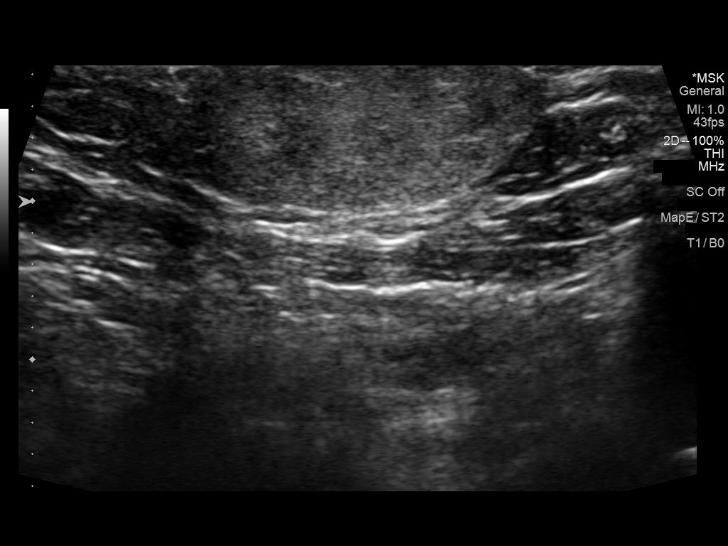
[im 11/12]
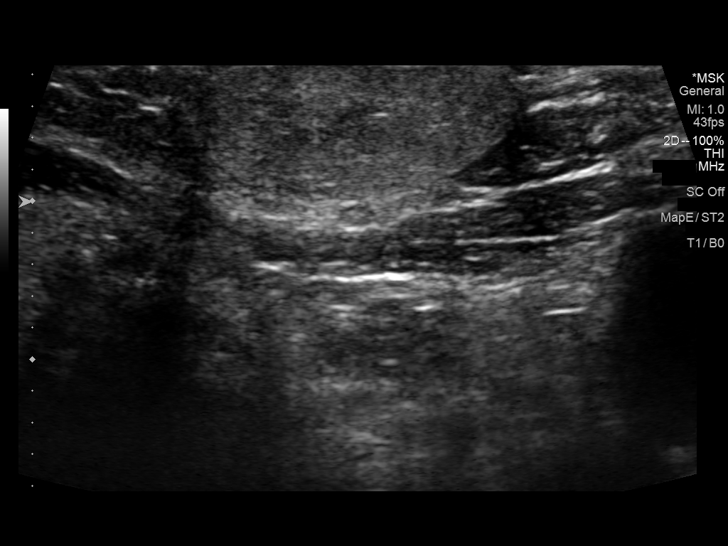
[im 12/12]
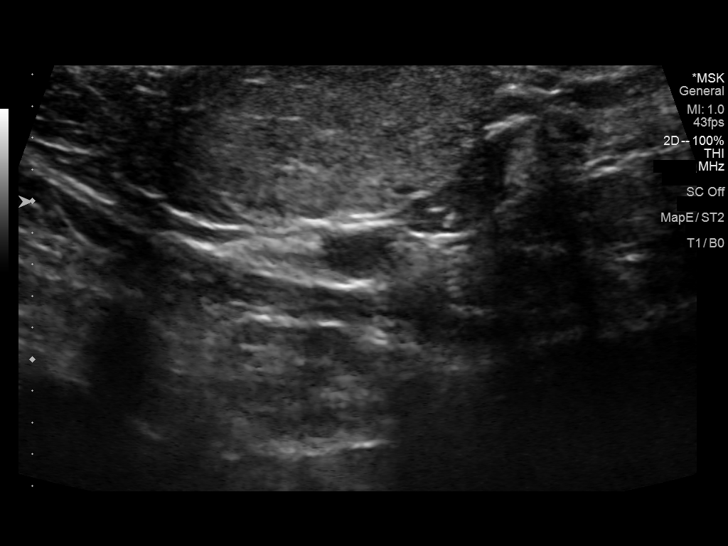

[12 of 12 positions shown; findings below may reference images not displayed]

FINDINGS: Grayscale and color duplex ultrasound performed in the region
clinical concern. There is a heterogeneously echo echo soft tissue
nodule, hyperechoic to the adjacent strap musculature, inferior left
neck in the supraclavicular region. 2.2 cm x 0.9 cm x 2.2 cm. No
significant internal flow.
IMPRESSION: Soft tissue mass measuring 2.2 cm in the region of clinical concern,
potentially residual/recurrent thyroid tissue. Further evaluation
with contrast-enhanced neck CT recommended, particularly if the
prior thyroid surgery was performed for malignancy. Correlation with
thyroid lab studies may also be useful.

## 2019-12-11 ENCOUNTER — Other Ambulatory Visit: Payer: Self-pay | Admitting: *Deleted

## 2019-12-11 DIAGNOSIS — D0512 Intraductal carcinoma in situ of left breast: Secondary | ICD-10-CM

## 2019-12-12 ENCOUNTER — Inpatient Hospital Stay: Payer: Medicare Other

## 2019-12-12 ENCOUNTER — Other Ambulatory Visit: Payer: Self-pay

## 2019-12-12 ENCOUNTER — Inpatient Hospital Stay (HOSPITAL_BASED_OUTPATIENT_CLINIC_OR_DEPARTMENT_OTHER): Payer: Medicare Other | Admitting: Hematology and Oncology

## 2019-12-12 ENCOUNTER — Inpatient Hospital Stay: Payer: Medicare Other | Attending: Hematology and Oncology

## 2019-12-12 DIAGNOSIS — Z86000 Personal history of in-situ neoplasm of breast: Secondary | ICD-10-CM | POA: Diagnosis not present

## 2019-12-12 DIAGNOSIS — D0512 Intraductal carcinoma in situ of left breast: Secondary | ICD-10-CM

## 2019-12-12 DIAGNOSIS — M810 Age-related osteoporosis without current pathological fracture: Secondary | ICD-10-CM

## 2019-12-12 LAB — CBC WITH DIFFERENTIAL (CANCER CENTER ONLY)
Abs Immature Granulocytes: 0.01 10*3/uL (ref 0.00–0.07)
Basophils Absolute: 0.1 10*3/uL (ref 0.0–0.1)
Basophils Relative: 1 %
Eosinophils Absolute: 0.2 10*3/uL (ref 0.0–0.5)
Eosinophils Relative: 2 %
HCT: 38.3 % (ref 36.0–46.0)
Hemoglobin: 12.9 g/dL (ref 12.0–15.0)
Immature Granulocytes: 0 %
Lymphocytes Relative: 35 %
Lymphs Abs: 2.5 10*3/uL (ref 0.7–4.0)
MCH: 29.7 pg (ref 26.0–34.0)
MCHC: 33.7 g/dL (ref 30.0–36.0)
MCV: 88.2 fL (ref 80.0–100.0)
Monocytes Absolute: 0.8 10*3/uL (ref 0.1–1.0)
Monocytes Relative: 11 %
Neutro Abs: 3.7 10*3/uL (ref 1.7–7.7)
Neutrophils Relative %: 51 %
Platelet Count: 188 10*3/uL (ref 150–400)
RBC: 4.34 MIL/uL (ref 3.87–5.11)
RDW: 13.2 % (ref 11.5–15.5)
WBC Count: 7.2 10*3/uL (ref 4.0–10.5)
nRBC: 0 % (ref 0.0–0.2)

## 2019-12-12 LAB — CMP (CANCER CENTER ONLY)
ALT: 15 U/L (ref 0–44)
AST: 16 U/L (ref 15–41)
Albumin: 3.8 g/dL (ref 3.5–5.0)
Alkaline Phosphatase: 60 U/L (ref 38–126)
Anion gap: 10 (ref 5–15)
BUN: 19 mg/dL (ref 8–23)
CO2: 24 mmol/L (ref 22–32)
Calcium: 9.4 mg/dL (ref 8.9–10.3)
Chloride: 104 mmol/L (ref 98–111)
Creatinine: 1.2 mg/dL — ABNORMAL HIGH (ref 0.44–1.00)
GFR, Estimated: 47 mL/min — ABNORMAL LOW (ref >60)
Glucose, Bld: 93 mg/dL (ref 70–99)
Potassium: 3.8 mmol/L (ref 3.5–5.1)
Sodium: 138 mmol/L (ref 135–145)
Total Bilirubin: 0.5 mg/dL (ref 0.3–1.2)
Total Protein: 6.9 g/dL (ref 6.5–8.1)

## 2019-12-12 MED ORDER — DENOSUMAB 60 MG/ML ~~LOC~~ SOSY
60.0000 mg | PREFILLED_SYRINGE | Freq: Once | SUBCUTANEOUS | Status: AC
  Administered 2019-12-12: 60 mg via SUBCUTANEOUS

## 2019-12-12 MED ORDER — DENOSUMAB 60 MG/ML ~~LOC~~ SOSY
PREFILLED_SYRINGE | SUBCUTANEOUS | Status: AC
  Filled 2019-12-12: qty 1

## 2019-12-12 NOTE — Patient Instructions (Signed)
Denosumab injection What is this medicine? DENOSUMAB (den oh sue mab) slows bone breakdown. Prolia is used to treat osteoporosis in women after menopause and in men, and in people who are taking corticosteroids for 6 months or more. Xgeva is used to treat a high calcium level due to cancer and to prevent bone fractures and other bone problems caused by multiple myeloma or cancer bone metastases. Xgeva is also used to treat giant cell tumor of the bone. This medicine may be used for other purposes; ask your health care provider or pharmacist if you have questions. COMMON BRAND NAME(S): Prolia, XGEVA What should I tell my health care provider before I take this medicine? They need to know if you have any of these conditions:  dental disease  having surgery or tooth extraction  infection  kidney disease  low levels of calcium or Vitamin D in the blood  malnutrition  on hemodialysis  skin conditions or sensitivity  thyroid or parathyroid disease  an unusual reaction to denosumab, other medicines, foods, dyes, or preservatives  pregnant or trying to get pregnant  breast-feeding How should I use this medicine? This medicine is for injection under the skin. It is given by a health care professional in a hospital or clinic setting. A special MedGuide will be given to you before each treatment. Be sure to read this information carefully each time. For Prolia, talk to your pediatrician regarding the use of this medicine in children. Special care may be needed. For Xgeva, talk to your pediatrician regarding the use of this medicine in children. While this drug may be prescribed for children as young as 13 years for selected conditions, precautions do apply. Overdosage: If you think you have taken too much of this medicine contact a poison control center or emergency room at once. NOTE: This medicine is only for you. Do not share this medicine with others. What if I miss a dose? It is  important not to miss your dose. Call your doctor or health care professional if you are unable to keep an appointment. What may interact with this medicine? Do not take this medicine with any of the following medications:  other medicines containing denosumab This medicine may also interact with the following medications:  medicines that lower your chance of fighting infection  steroid medicines like prednisone or cortisone This list may not describe all possible interactions. Give your health care provider a list of all the medicines, herbs, non-prescription drugs, or dietary supplements you use. Also tell them if you smoke, drink alcohol, or use illegal drugs. Some items may interact with your medicine. What should I watch for while using this medicine? Visit your doctor or health care professional for regular checks on your progress. Your doctor or health care professional may order blood tests and other tests to see how you are doing. Call your doctor or health care professional for advice if you get a fever, chills or sore throat, or other symptoms of a cold or flu. Do not treat yourself. This drug may decrease your body's ability to fight infection. Try to avoid being around people who are sick. You should make sure you get enough calcium and vitamin D while you are taking this medicine, unless your doctor tells you not to. Discuss the foods you eat and the vitamins you take with your health care professional. See your dentist regularly. Brush and floss your teeth as directed. Before you have any dental work done, tell your dentist you are   receiving this medicine. Do not become pregnant while taking this medicine or for 5 months after stopping it. Talk with your doctor or health care professional about your birth control options while taking this medicine. Women should inform their doctor if they wish to become pregnant or think they might be pregnant. There is a potential for serious side  effects to an unborn child. Talk to your health care professional or pharmacist for more information. What side effects may I notice from receiving this medicine? Side effects that you should report to your doctor or health care professional as soon as possible:  allergic reactions like skin rash, itching or hives, swelling of the face, lips, or tongue  bone pain  breathing problems  dizziness  jaw pain, especially after dental work  redness, blistering, peeling of the skin  signs and symptoms of infection like fever or chills; cough; sore throat; pain or trouble passing urine  signs of low calcium like fast heartbeat, muscle cramps or muscle pain; pain, tingling, numbness in the hands or feet; seizures  unusual bleeding or bruising  unusually weak or tired Side effects that usually do not require medical attention (report to your doctor or health care professional if they continue or are bothersome):  constipation  diarrhea  headache  joint pain  loss of appetite  muscle pain  runny nose  tiredness  upset stomach This list may not describe all possible side effects. Call your doctor for medical advice about side effects. You may report side effects to FDA at 1-800-FDA-1088. Where should I keep my medicine? This medicine is only given in a clinic, doctor's office, or other health care setting and will not be stored at home. NOTE: This sheet is a summary. It may not cover all possible information. If you have questions about this medicine, talk to your doctor, pharmacist, or health care provider.  2020 Elsevier/Gold Standard (2017-04-27 16:10:44)

## 2019-12-12 NOTE — Assessment & Plan Note (Signed)
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  Completed the December 2019    Breast Cancer Surveillance: 1. Breast exam :12/12/2019 benign . 2. Mammogram  06/24/2019 no abnormalities. Postsurgical changes. Breast Density CategoryB. 3. Bone density 07/23/2018: OsteoporosisT score at -2.6 On Prolia every 6 months started June 2017  CT neck 12/06/2018: No neck mass or adenopathy    Return to clinic in 1 year for follow-up

## 2019-12-12 NOTE — Progress Notes (Signed)
Patient Care Team: Lajean Manes, MD as PCP - General (Internal Medicine)  DIAGNOSIS:  Encounter Diagnosis  Name Primary?  . Neoplasm of left breast, primary tumor staging category Tis: ductal carcinoma in situ (DCIS)     SUMMARY OF ONCOLOGIC HISTORY: Oncology History  Neoplasm of left breast, primary tumor staging category Tis: ductal carcinoma in situ (DCIS)  06/24/2012 Surgery   Left breast lumpectomy and sentinel lymph node biopsy DCIS   08/30/2012 - 09/28/2012 Radiation Therapy   Radiation to lumpectomy site   10/02/2012 - 10/02/2013 Anti-estrogen oral therapy   Tamoxifen 20 mg daily started 10/02/2012 discontinued due to superficial leg vein thrombosis. Aromasin 25 mg started July 2015 stopped for muscle aches, restarted 08/28/2013 stopped in 10/02/13 due to elevation AST adn ALT   10/02/2012 - 12/2017 Anti-estrogen oral therapy   Arimidex 1 mg daily   12/23/2015 Miscellaneous   Genetic testing: Negative for any mutations of 20 genes on the breast/ovarian cancer panel through [facility]     CHIEF COMPLIANT: Surveillance of breast cancer  INTERVAL HISTORY: Laurie Gomez is a 79 year old with above-mentioned history of DCIS of the left breast treated with lumpectomy radiation is completed 5 years of antiestrogen therapy and is currently on surveillance.  She is doing extremely well without any new problems or concerns.  She tells me that her endurance is slightly lower than the years before.  Denies any pain or discomfort in the breast other than occasional discomfort in the arm for no reason.   ALLERGIES:  has No Known Allergies.  MEDICATIONS:  Current Outpatient Medications  Medication Sig Dispense Refill  . amlodipine-benazepril (LOTREL) 2.5-10 MG per capsule   3  . levothyroxine (SYNTHROID) 75 MCG tablet Take 75 mcg by mouth daily before breakfast.     . Multiple Vitamin (MULTIVITAMIN) tablet Take 1 tablet by mouth daily.     . Psyllium (WAL-MUCIL PO)  Take 2 tablets by mouth daily.    Marland Kitchen saccharomyces boulardii (FLORASTOR) 250 MG capsule Take 1 capsule (250 mg total) by mouth 2 (two) times daily.    Marland Kitchen triamterene-hydrochlorothiazide (MAXZIDE) 75-50 MG per tablet Take 1 tablet by mouth daily. Take 1/2 tablet daily.     No current facility-administered medications for this visit.    PHYSICAL EXAMINATION: ECOG PERFORMANCE STATUS: 1 - Symptomatic but completely ambulatory  Vitals:   12/12/19 0929  BP: 135/65  Pulse: 69  Resp: 18  Temp: (!) 97.5 F (36.4 C)  SpO2: 99%   Filed Weights   12/12/19 0929  Weight: 176 lb 8 oz (80.1 kg)    BREAST: No palpable masses or nodules in either right or left breasts.  Scar tissue in the left breast from prior lumpectomy.  No palpable axillary supraclavicular or infraclavicular adenopathy no breast tenderness or nipple discharge. (exam performed in the presence of a chaperone)  LABORATORY DATA:  I have reviewed the data as listed CMP Latest Ref Rng & Units 06/18/2019 12/13/2018 06/17/2018  Glucose 70 - 99 mg/dL 92 97 123(H)  BUN 8 - 23 mg/dL 17 22 21   Creatinine 0.44 - 1.00 mg/dL 1.14(H) 1.07(H) 1.17(H)  Sodium 135 - 145 mmol/L 140 140 137  Potassium 3.5 - 5.1 mmol/L 4.3 3.9 3.9  Chloride 98 - 111 mmol/L 104 104 103  CO2 22 - 32 mmol/L 26 28 23   Calcium 8.9 - 10.3 mg/dL 9.7 9.1 9.5  Total Protein 6.5 - 8.1 g/dL 6.9 6.9 7.5  Total Bilirubin 0.3 - 1.2 mg/dL 0.5  0.5 0.3  Alkaline Phos 38 - 126 U/L 63 56 67  AST 15 - 41 U/L 13(L) 15 15  ALT 0 - 44 U/L 15 16 17     Lab Results  Component Value Date   WBC 7.2 12/12/2019   HGB 12.9 12/12/2019   HCT 38.3 12/12/2019   MCV 88.2 12/12/2019   PLT 188 12/12/2019   NEUTROABS 3.7 12/12/2019    ASSESSMENT & PLAN:  Neoplasm of left breast, 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  Completed the December 2019    Breast Cancer Surveillance: 1. Breast exam :12/12/2019 benign . 2. Mammogram  06/24/2019 no abnormalities. Postsurgical changes. Breast Density CategoryB. 3. Bone density 07/23/2018: OsteoporosisT score at -2.6 On Prolia every 6 months started June 2017  CT neck 12/06/2018: No neck mass or adenopathy Labs and Prolia in 6 months.  I discussed with her that if she gets the Prolia with Dr. Felipa Eth then she probably does not need to follow-up with [place] in the future.  Return to clinic in 1 year for follow-up  .  Will make this appointment but she can call and cancel it if she can get her Prolia through Dr. Felipa Eth.    No orders of the defined types were placed in this encounter.  The patient has a good understanding of the overall plan. she agrees with it. she will call with any problems that may develop before the next visit here. Total time spent: 30 mins including face to face time and time spent for planning, charting and co-ordination of care   Harriette Ohara, MD 12/12/19

## 2019-12-15 ENCOUNTER — Telehealth: Payer: Self-pay | Admitting: Hematology and Oncology

## 2019-12-15 NOTE — Telephone Encounter (Signed)
Scheduled per 12/10 los. Called and spoke with pt, confirmed 6/10 appts

## 2019-12-28 DIAGNOSIS — K5792 Diverticulitis of intestine, part unspecified, without perforation or abscess without bleeding: Secondary | ICD-10-CM | POA: Diagnosis not present

## 2020-03-22 DIAGNOSIS — K5792 Diverticulitis of intestine, part unspecified, without perforation or abscess without bleeding: Secondary | ICD-10-CM | POA: Diagnosis not present

## 2020-05-07 ENCOUNTER — Other Ambulatory Visit: Payer: Self-pay | Admitting: Geriatric Medicine

## 2020-05-07 ENCOUNTER — Telehealth: Payer: Self-pay | Admitting: Hematology and Oncology

## 2020-05-07 DIAGNOSIS — Z1231 Encounter for screening mammogram for malignant neoplasm of breast: Secondary | ICD-10-CM

## 2020-05-07 NOTE — Telephone Encounter (Signed)
Cancelled appts per 5/6 sch msg. Pt said she would be receiving injections at her other doctor's office. There is no need to r/s.

## 2020-05-14 DIAGNOSIS — Z853 Personal history of malignant neoplasm of breast: Secondary | ICD-10-CM | POA: Diagnosis not present

## 2020-05-14 DIAGNOSIS — Z0000 Encounter for general adult medical examination without abnormal findings: Secondary | ICD-10-CM | POA: Diagnosis not present

## 2020-05-14 DIAGNOSIS — Z1389 Encounter for screening for other disorder: Secondary | ICD-10-CM | POA: Diagnosis not present

## 2020-05-14 DIAGNOSIS — Z79899 Other long term (current) drug therapy: Secondary | ICD-10-CM | POA: Diagnosis not present

## 2020-05-14 DIAGNOSIS — I129 Hypertensive chronic kidney disease with stage 1 through stage 4 chronic kidney disease, or unspecified chronic kidney disease: Secondary | ICD-10-CM | POA: Diagnosis not present

## 2020-05-14 DIAGNOSIS — N1831 Chronic kidney disease, stage 3a: Secondary | ICD-10-CM | POA: Diagnosis not present

## 2020-05-14 DIAGNOSIS — E039 Hypothyroidism, unspecified: Secondary | ICD-10-CM | POA: Diagnosis not present

## 2020-05-14 DIAGNOSIS — M810 Age-related osteoporosis without current pathological fracture: Secondary | ICD-10-CM | POA: Diagnosis not present

## 2020-06-03 ENCOUNTER — Telehealth: Payer: Self-pay | Admitting: *Deleted

## 2020-06-03 ENCOUNTER — Telehealth: Payer: Self-pay | Admitting: Hematology and Oncology

## 2020-06-03 NOTE — Telephone Encounter (Signed)
Received call from pt requesting to cancel yearly f/u apt in December.  States she is following up with her PCP and will return if she receives an abnormal mammogram in the future.

## 2020-06-03 NOTE — Telephone Encounter (Signed)
R/s per prov pal, per 12/10 los, pt aware

## 2020-06-03 NOTE — Telephone Encounter (Signed)
Received VM from pt.  RN attempt x1 to contact pt, no answer.  LVM to return call.

## 2020-06-11 ENCOUNTER — Ambulatory Visit: Payer: Medicare Other

## 2020-06-11 ENCOUNTER — Other Ambulatory Visit: Payer: Medicare Other

## 2020-06-11 DIAGNOSIS — M810 Age-related osteoporosis without current pathological fracture: Secondary | ICD-10-CM | POA: Diagnosis not present

## 2020-07-02 ENCOUNTER — Other Ambulatory Visit: Payer: Self-pay

## 2020-07-02 ENCOUNTER — Ambulatory Visit
Admission: RE | Admit: 2020-07-02 | Discharge: 2020-07-02 | Disposition: A | Discharge status: Home / Self Care | Payer: Medicare Other | Source: Ambulatory Visit | Attending: Geriatric Medicine | Admitting: Geriatric Medicine

## 2020-07-02 DIAGNOSIS — Z1231 Encounter for screening mammogram for malignant neoplasm of breast: Secondary | ICD-10-CM | POA: Diagnosis not present

## 2020-07-08 ENCOUNTER — Other Ambulatory Visit: Payer: Self-pay | Admitting: Geriatric Medicine

## 2020-07-08 DIAGNOSIS — R928 Other abnormal and inconclusive findings on diagnostic imaging of breast: Secondary | ICD-10-CM

## 2020-07-27 ENCOUNTER — Other Ambulatory Visit: Payer: Self-pay | Admitting: Geriatric Medicine

## 2020-07-27 ENCOUNTER — Ambulatory Visit: Admission: RE | Admit: 2020-07-27 | Payer: Medicare Other | Source: Ambulatory Visit

## 2020-07-27 ENCOUNTER — Ambulatory Visit
Admission: RE | Admit: 2020-07-27 | Discharge: 2020-07-27 | Disposition: A | Discharge status: Home / Self Care | Payer: Medicare Other | Source: Ambulatory Visit | Attending: Geriatric Medicine | Admitting: Geriatric Medicine

## 2020-07-27 ENCOUNTER — Other Ambulatory Visit: Payer: Self-pay

## 2020-07-27 DIAGNOSIS — R928 Other abnormal and inconclusive findings on diagnostic imaging of breast: Secondary | ICD-10-CM

## 2020-07-27 DIAGNOSIS — R921 Mammographic calcification found on diagnostic imaging of breast: Secondary | ICD-10-CM | POA: Diagnosis not present

## 2020-07-27 DIAGNOSIS — R922 Inconclusive mammogram: Secondary | ICD-10-CM | POA: Diagnosis not present

## 2020-08-04 ENCOUNTER — Ambulatory Visit
Admission: RE | Admit: 2020-08-04 | Discharge: 2020-08-04 | Disposition: A | Discharge status: Home / Self Care | Payer: Medicare Other | Source: Ambulatory Visit | Attending: Geriatric Medicine | Admitting: Geriatric Medicine

## 2020-08-04 ENCOUNTER — Other Ambulatory Visit: Payer: Self-pay

## 2020-08-04 ENCOUNTER — Encounter: Payer: Self-pay | Admitting: Hematology and Oncology

## 2020-08-04 ENCOUNTER — Other Ambulatory Visit: Payer: Self-pay | Admitting: Geriatric Medicine

## 2020-08-04 DIAGNOSIS — R921 Mammographic calcification found on diagnostic imaging of breast: Secondary | ICD-10-CM | POA: Diagnosis not present

## 2020-08-04 DIAGNOSIS — R928 Other abnormal and inconclusive findings on diagnostic imaging of breast: Secondary | ICD-10-CM

## 2020-08-04 DIAGNOSIS — N6032 Fibrosclerosis of left breast: Secondary | ICD-10-CM | POA: Diagnosis not present

## 2020-11-09 DIAGNOSIS — I129 Hypertensive chronic kidney disease with stage 1 through stage 4 chronic kidney disease, or unspecified chronic kidney disease: Secondary | ICD-10-CM | POA: Diagnosis not present

## 2020-11-09 DIAGNOSIS — D170 Benign lipomatous neoplasm of skin and subcutaneous tissue of head, face and neck: Secondary | ICD-10-CM | POA: Diagnosis not present

## 2020-11-09 DIAGNOSIS — N1831 Chronic kidney disease, stage 3a: Secondary | ICD-10-CM | POA: Diagnosis not present

## 2020-11-09 DIAGNOSIS — E7800 Pure hypercholesterolemia, unspecified: Secondary | ICD-10-CM | POA: Diagnosis not present

## 2020-11-09 DIAGNOSIS — Z23 Encounter for immunization: Secondary | ICD-10-CM | POA: Diagnosis not present

## 2020-12-06 ENCOUNTER — Other Ambulatory Visit: Payer: Medicare Other

## 2020-12-06 ENCOUNTER — Ambulatory Visit: Payer: Medicare Other | Admitting: Hematology and Oncology

## 2020-12-06 ENCOUNTER — Ambulatory Visit: Payer: Medicare Other

## 2020-12-10 ENCOUNTER — Ambulatory Visit: Payer: Medicare Other

## 2020-12-10 ENCOUNTER — Ambulatory Visit: Payer: Medicare Other | Admitting: Hematology and Oncology

## 2020-12-10 ENCOUNTER — Other Ambulatory Visit: Payer: Medicare Other

## 2020-12-17 DIAGNOSIS — M810 Age-related osteoporosis without current pathological fracture: Secondary | ICD-10-CM | POA: Diagnosis not present

## 2021-02-02 DIAGNOSIS — D481 Neoplasm of uncertain behavior of connective and other soft tissue: Secondary | ICD-10-CM | POA: Diagnosis not present

## 2021-02-28 DIAGNOSIS — D481 Neoplasm of uncertain behavior of connective and other soft tissue: Secondary | ICD-10-CM | POA: Diagnosis not present

## 2021-03-12 DIAGNOSIS — N760 Acute vaginitis: Secondary | ICD-10-CM | POA: Diagnosis not present

## 2021-03-12 DIAGNOSIS — N9489 Other specified conditions associated with female genital organs and menstrual cycle: Secondary | ICD-10-CM | POA: Diagnosis not present

## 2021-03-15 ENCOUNTER — Other Ambulatory Visit: Payer: Self-pay | Admitting: Plastic Surgery

## 2021-03-15 DIAGNOSIS — D170 Benign lipomatous neoplasm of skin and subcutaneous tissue of head, face and neck: Secondary | ICD-10-CM | POA: Diagnosis not present

## 2021-03-15 DIAGNOSIS — D481 Neoplasm of uncertain behavior of connective and other soft tissue: Secondary | ICD-10-CM | POA: Diagnosis not present

## 2021-04-06 DIAGNOSIS — Z01419 Encounter for gynecological examination (general) (routine) without abnormal findings: Secondary | ICD-10-CM | POA: Diagnosis not present

## 2021-04-06 DIAGNOSIS — N898 Other specified noninflammatory disorders of vagina: Secondary | ICD-10-CM | POA: Diagnosis not present

## 2021-04-06 DIAGNOSIS — Z0142 Encounter for cervical smear to confirm findings of recent normal smear following initial abnormal smear: Secondary | ICD-10-CM | POA: Diagnosis not present

## 2021-04-06 DIAGNOSIS — Z01411 Encounter for gynecological examination (general) (routine) with abnormal findings: Secondary | ICD-10-CM | POA: Diagnosis not present

## 2021-04-06 DIAGNOSIS — Z6830 Body mass index (BMI) 30.0-30.9, adult: Secondary | ICD-10-CM | POA: Diagnosis not present

## 2021-04-06 DIAGNOSIS — Z124 Encounter for screening for malignant neoplasm of cervix: Secondary | ICD-10-CM | POA: Diagnosis not present

## 2021-05-20 DIAGNOSIS — Z79899 Other long term (current) drug therapy: Secondary | ICD-10-CM | POA: Diagnosis not present

## 2021-05-20 DIAGNOSIS — Z0000 Encounter for general adult medical examination without abnormal findings: Secondary | ICD-10-CM | POA: Diagnosis not present

## 2021-05-20 DIAGNOSIS — N1831 Chronic kidney disease, stage 3a: Secondary | ICD-10-CM | POA: Diagnosis not present

## 2021-05-20 DIAGNOSIS — E7800 Pure hypercholesterolemia, unspecified: Secondary | ICD-10-CM | POA: Diagnosis not present

## 2021-05-20 DIAGNOSIS — I129 Hypertensive chronic kidney disease with stage 1 through stage 4 chronic kidney disease, or unspecified chronic kidney disease: Secondary | ICD-10-CM | POA: Diagnosis not present

## 2021-05-20 DIAGNOSIS — Z1331 Encounter for screening for depression: Secondary | ICD-10-CM | POA: Diagnosis not present

## 2021-05-20 DIAGNOSIS — E039 Hypothyroidism, unspecified: Secondary | ICD-10-CM | POA: Diagnosis not present

## 2021-06-07 ENCOUNTER — Other Ambulatory Visit: Payer: Self-pay | Admitting: Geriatric Medicine

## 2021-06-07 DIAGNOSIS — Z1231 Encounter for screening mammogram for malignant neoplasm of breast: Secondary | ICD-10-CM

## 2021-06-27 DIAGNOSIS — M810 Age-related osteoporosis without current pathological fracture: Secondary | ICD-10-CM | POA: Diagnosis not present

## 2021-07-06 ENCOUNTER — Ambulatory Visit
Admission: RE | Admit: 2021-07-06 | Discharge: 2021-07-06 | Disposition: A | Discharge status: Home / Self Care | Payer: Medicare Other | Source: Ambulatory Visit | Attending: Geriatric Medicine | Admitting: Geriatric Medicine

## 2021-07-06 DIAGNOSIS — Z1231 Encounter for screening mammogram for malignant neoplasm of breast: Secondary | ICD-10-CM | POA: Diagnosis not present

## 2021-08-17 IMAGING — MG MM BREAST LOCALIZATION CLIP
4 series · 4 of 12 positions shown · non-contrast
Comparison: Previous exam(s).

CLINICAL DATA: Status post left breast stereotactic biopsy.

EXAM:
3D DIAGNOSTIC LEFT MAMMOGRAM POST STEREOTACTIC BIOPSY

[L CC synth-2D]
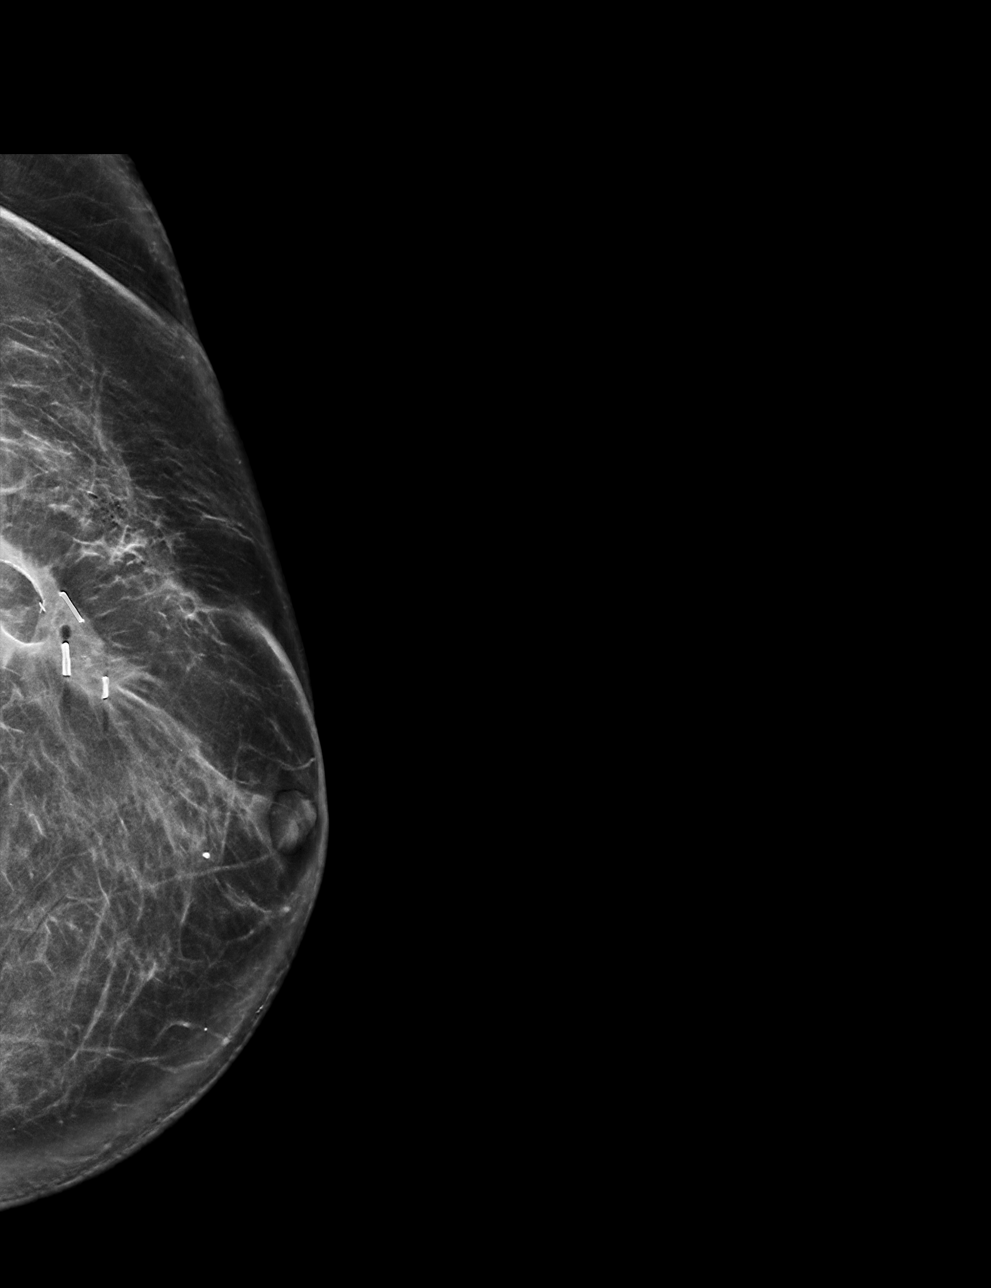

[L LM synth-2D]
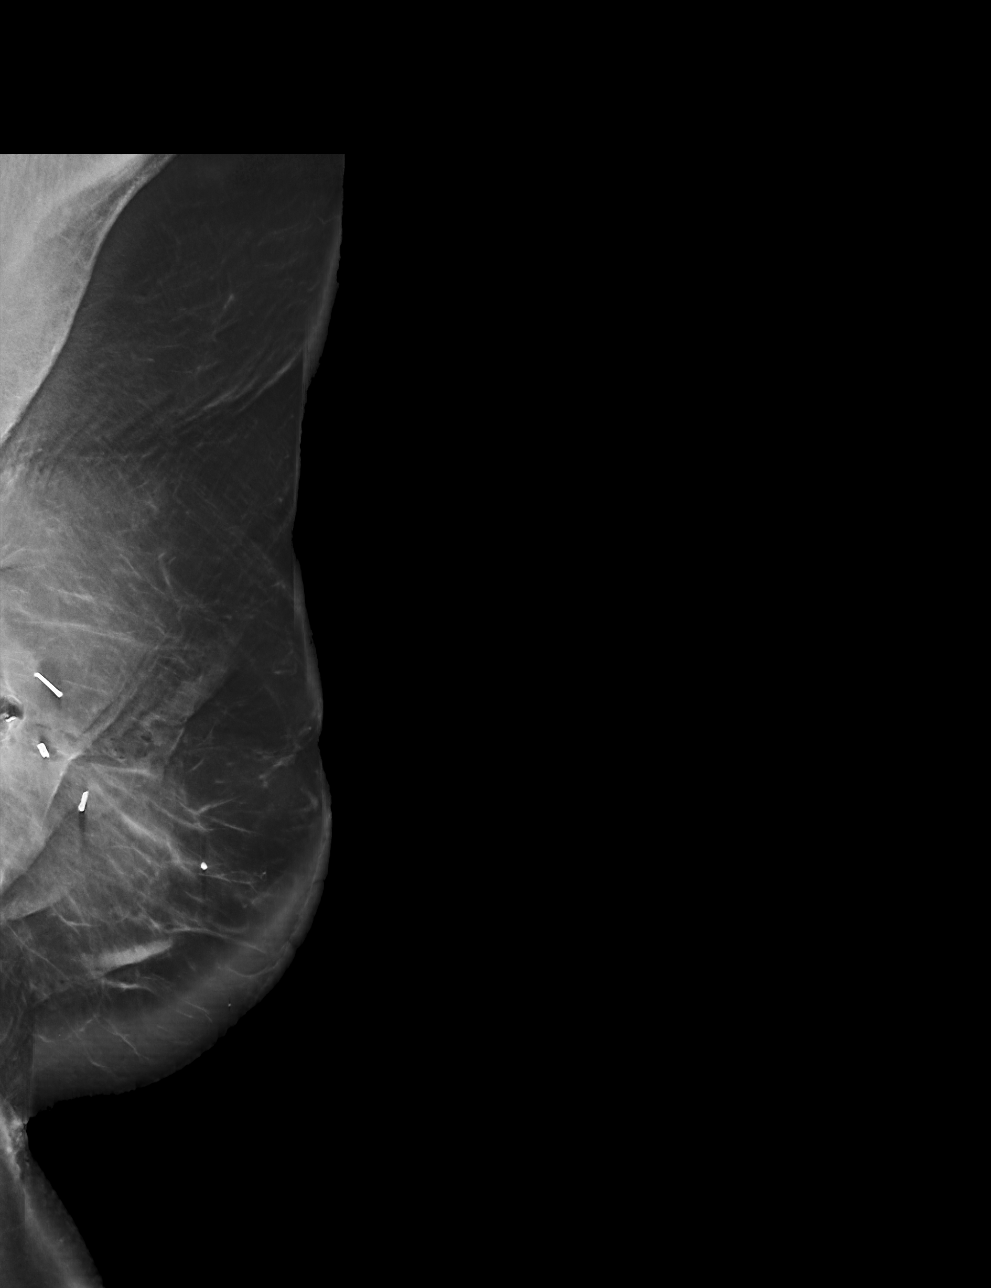

[L LM tomo · tomo slice 51/102.0]
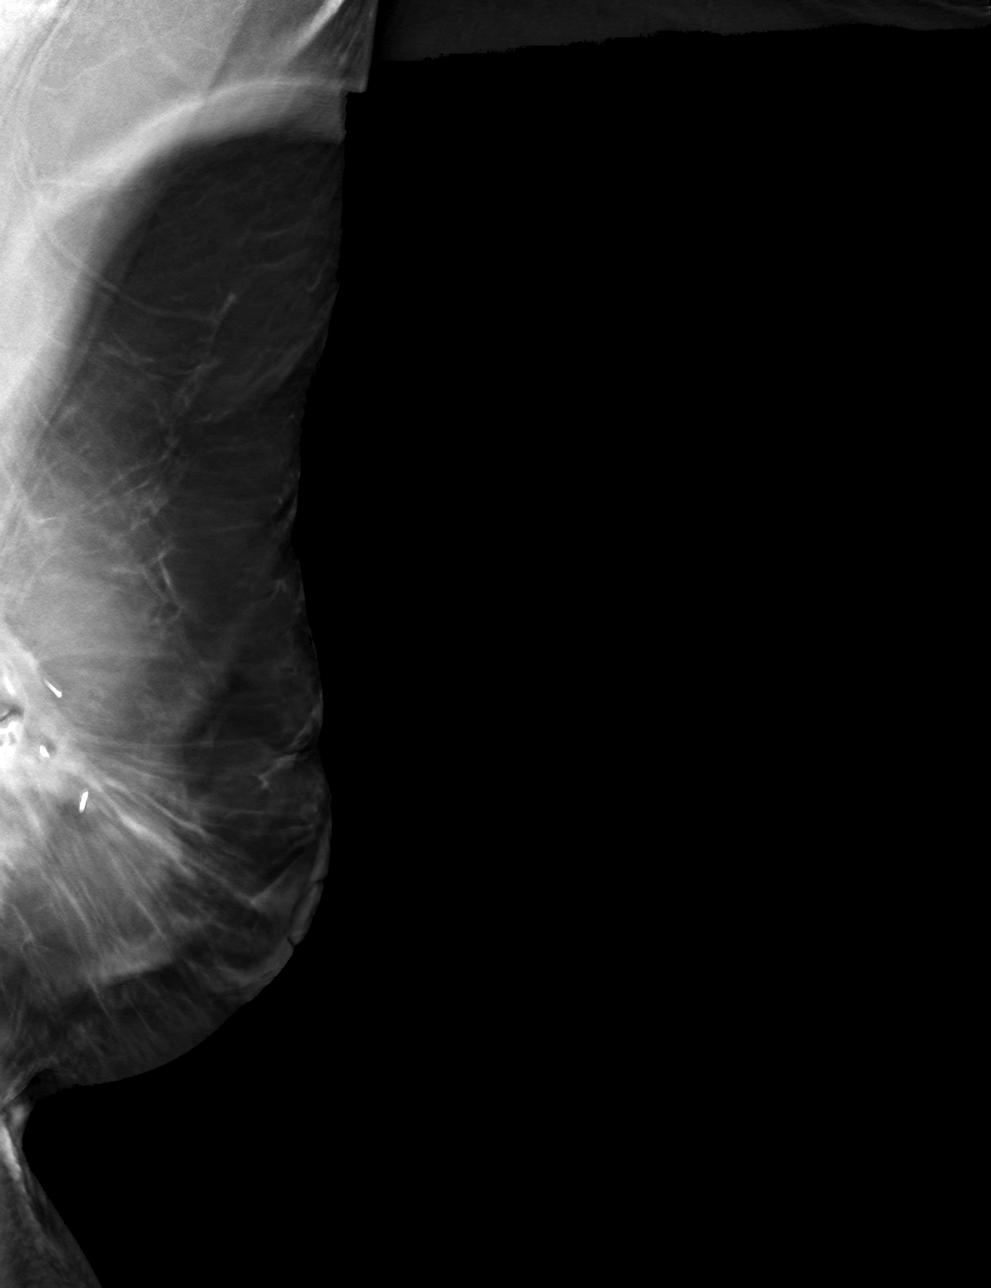

[L CC tomo · tomo slice 37/74.0]
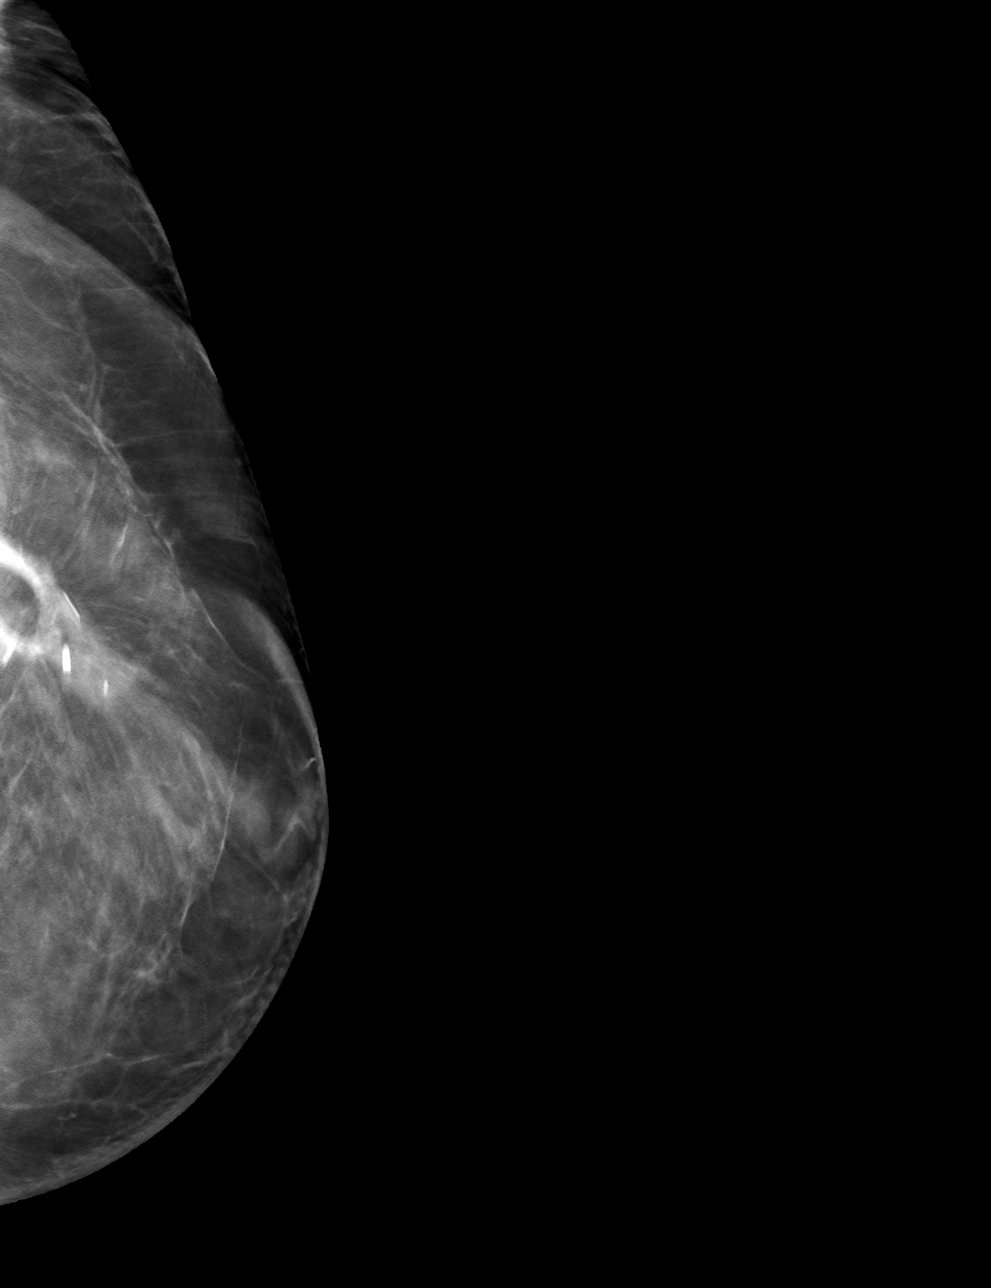

[4 of 12 positions shown; findings below may reference images not displayed]

FINDINGS: 3D Mammographic images were obtained following stereotactic guided
biopsy of the left breast. The biopsy marking clip is in expected
position at the site of biopsy.
IMPRESSION: Appropriate positioning of the X shaped biopsy marking clip at the
site of biopsy in the left lumpectomy bed.

Final Assessment: Post Procedure Mammograms for Marker Placement

## 2021-08-17 IMAGING — MG MM BREAST BX W LOC DEV 1ST LESION IMAGE BX SPEC STEREO GUIDE*L*
8 series · 8 of 24 positions shown · non-contrast
Comparison: Previous exams.
COMPARISON: Previous exams.

Addendum:
CLINICAL DATA: 80-year-old female with indeterminate breast
calcifications.

EXAM:
LEFT BREAST STEREOTACTIC CORE NEEDLE BIOPSY

[L (1 of 3)]
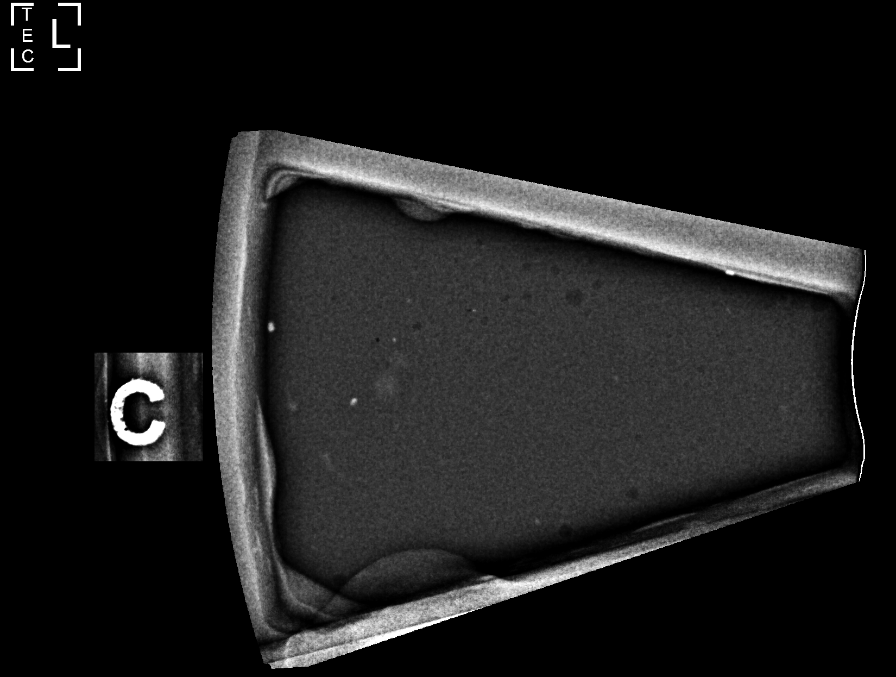

[L (2 of 3)]
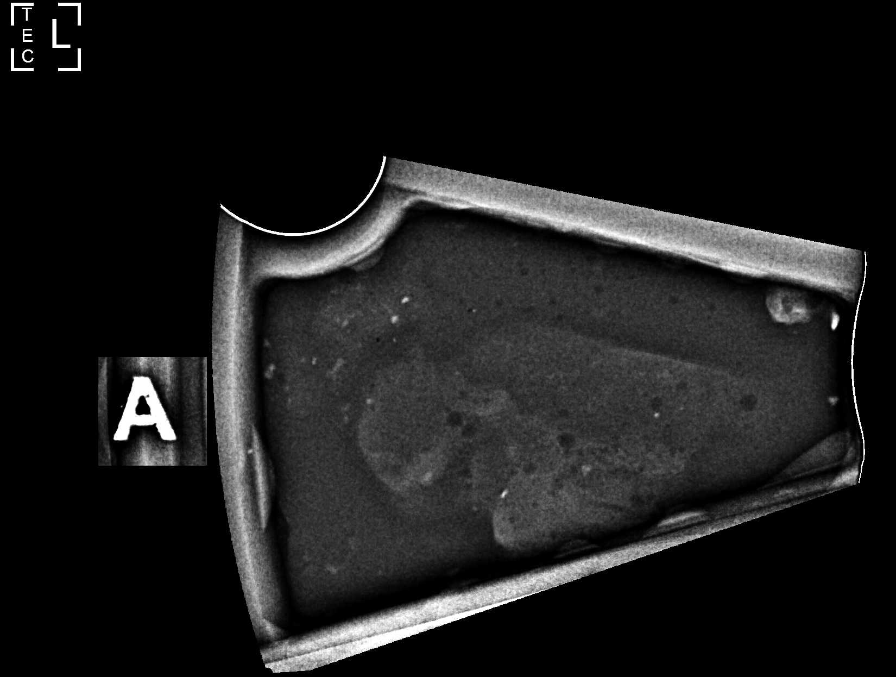

[L (3 of 3)]
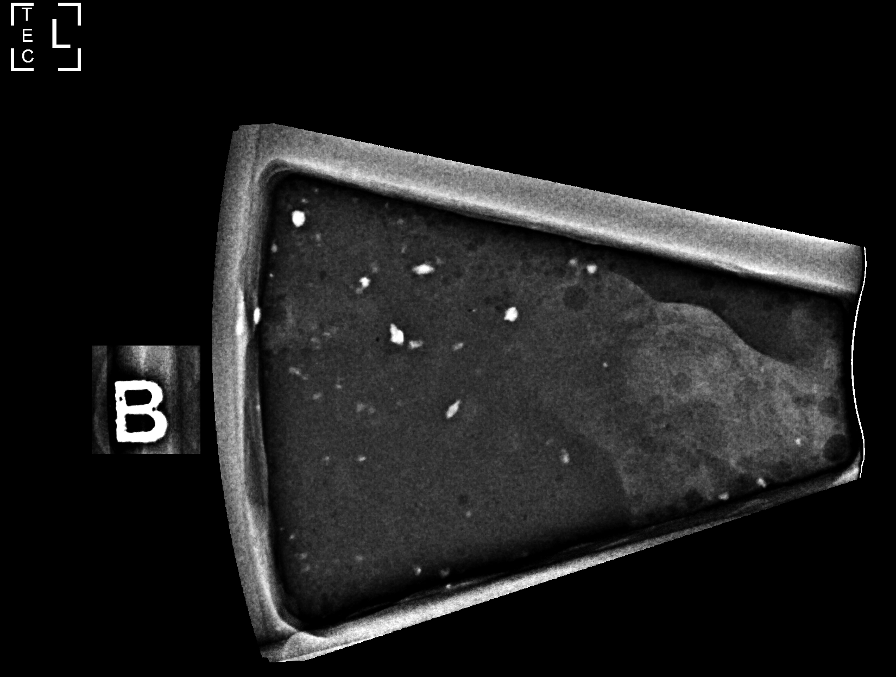

[L LM]
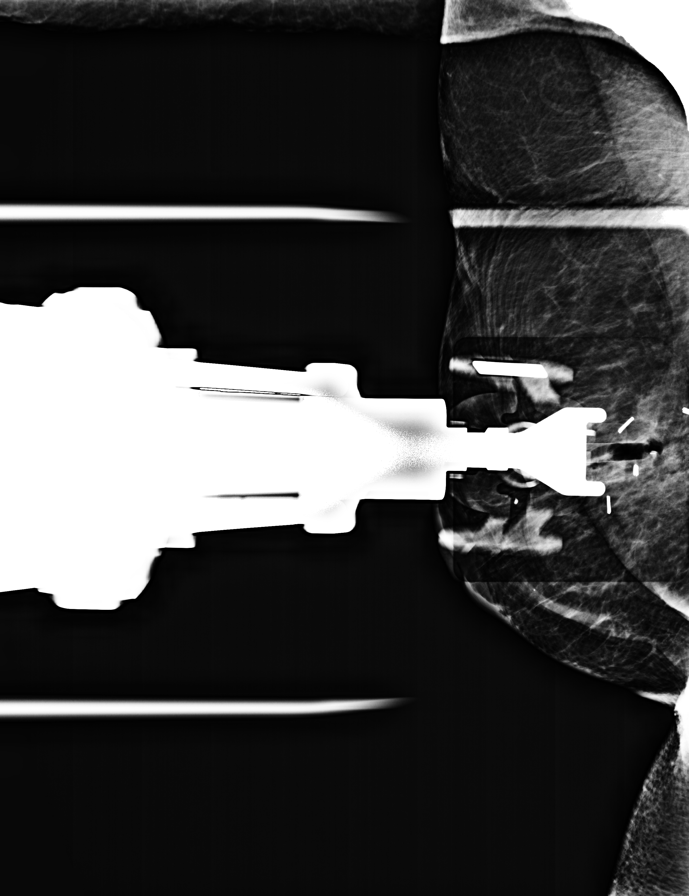

[L LM tomo (1 of 4) · tomo slice 39/78.0]
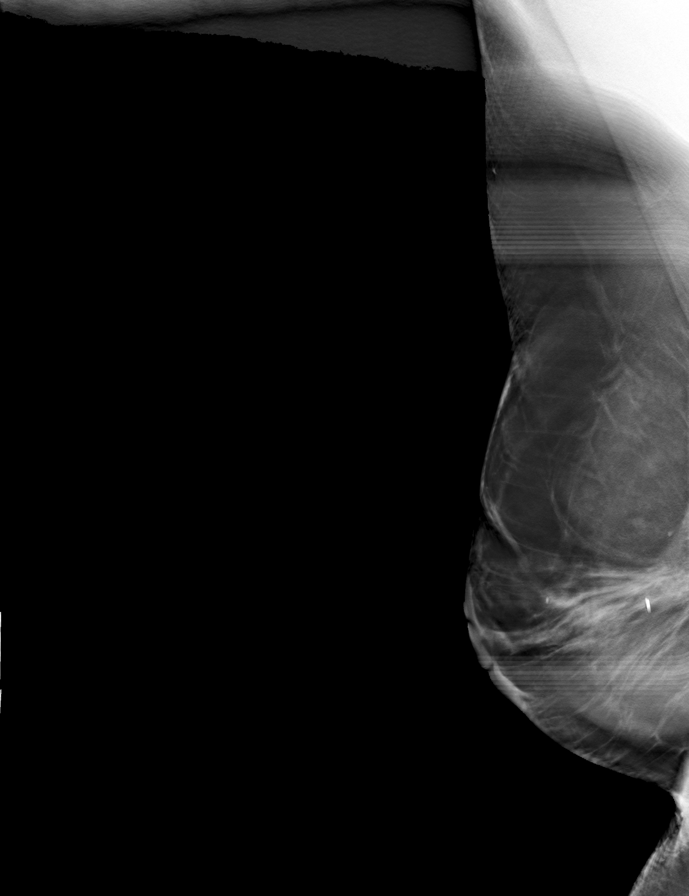

[L LM tomo (2 of 4) · tomo slice 42/83.0]
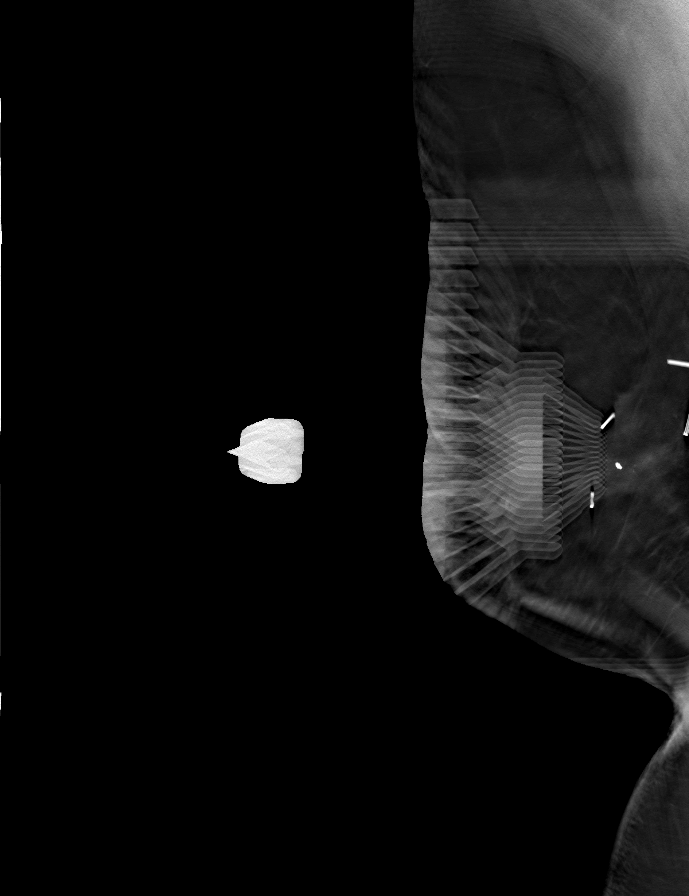

[L LM tomo (3 of 4) · tomo slice 39/77.0]
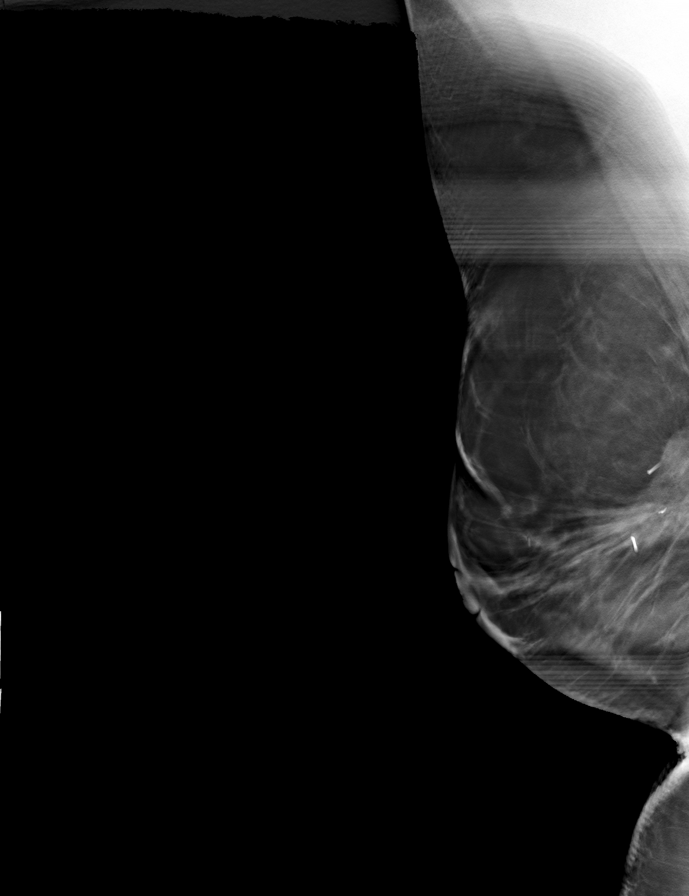

[L LM tomo (4 of 4) · tomo slice 42/83.0]
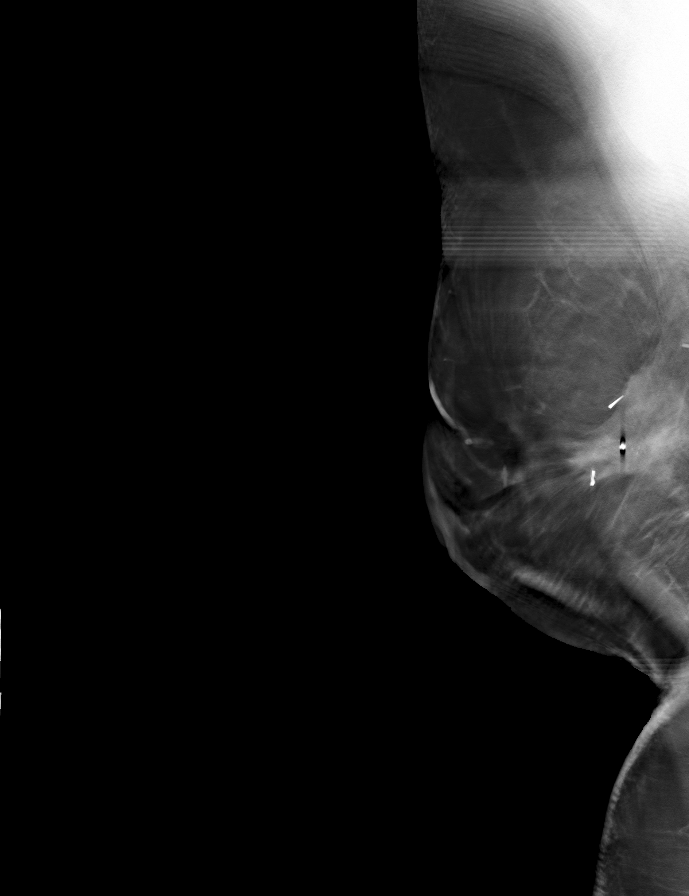

[8 of 24 positions shown; findings below may reference images not displayed]



Using sterile technique and 1% Lidocaine as local anesthetic, under
stereotactic guidance, a 9 gauge vacuum assisted device was used to
perform core needle biopsy of calcifications in the far posterior
upper outer left breast using a lateral approach. Specimen
radiograph was performed showing numerous calcifications within the
specimen. Specimens with calcifications are identified for
pathology.

Lesion quadrant: Upper outer quadrant

At the conclusion of the procedure, an X shaped tissue marker clip
was deployed into the biopsy cavity. Follow-up 2-view mammogram was
performed and dictated separately.
IMPRESSION: Stereotactic-guided biopsy of the left breast. No apparent
complications.

ADDENDUM:
Pathology revealed FAT NECROSIS, HYALINE FIBROSIS AND DYSTROPHIC
CALCIFICATIONS, CONSISTENT WITH PREVIOUS PROCEDURE-RELATED CHANGES-
NEGATIVE FOR CARCINOMA of the LEFT breast, upper outer posterior, (X
clip). This was found to be concordant by Dr. Kuinsty Ghule.

Pathology results were discussed with the patient by telephone. The
patient reported doing well after the biopsy with tenderness at the
site. Post biopsy instructions and care were reviewed and questions
were answered. The patient was encouraged to call The [REDACTED]

The patient was instructed to return for annual screening
mammography and informed a reminder notice would be sent regarding
this appointment.

Pathology results reported by Oana Lacey RN on 08/05/2020.



Using sterile technique and 1% Lidocaine as local anesthetic, under
stereotactic guidance, a 9 gauge vacuum assisted device was used to
perform core needle biopsy of calcifications in the far posterior
upper outer left breast using a lateral approach. Specimen
radiograph was performed showing numerous calcifications within the
specimen. Specimens with calcifications are identified for
pathology.

Lesion quadrant: Upper outer quadrant

At the conclusion of the procedure, an X shaped tissue marker clip
was deployed into the biopsy cavity. Follow-up 2-view mammogram was
performed and dictated separately.
IMPRESSION: Stereotactic-guided biopsy of the left breast. No apparent
complications.

## 2021-09-27 DIAGNOSIS — E039 Hypothyroidism, unspecified: Secondary | ICD-10-CM | POA: Diagnosis not present

## 2021-09-27 DIAGNOSIS — E7800 Pure hypercholesterolemia, unspecified: Secondary | ICD-10-CM | POA: Diagnosis not present

## 2021-09-27 DIAGNOSIS — N1831 Chronic kidney disease, stage 3a: Secondary | ICD-10-CM | POA: Diagnosis not present

## 2021-09-27 DIAGNOSIS — I129 Hypertensive chronic kidney disease with stage 1 through stage 4 chronic kidney disease, or unspecified chronic kidney disease: Secondary | ICD-10-CM | POA: Diagnosis not present

## 2021-12-21 DIAGNOSIS — M810 Age-related osteoporosis without current pathological fracture: Secondary | ICD-10-CM | POA: Diagnosis not present

## 2022-01-12 DIAGNOSIS — K5790 Diverticulosis of intestine, part unspecified, without perforation or abscess without bleeding: Secondary | ICD-10-CM | POA: Diagnosis not present

## 2022-04-26 ENCOUNTER — Other Ambulatory Visit: Payer: Self-pay | Admitting: Internal Medicine

## 2022-04-26 DIAGNOSIS — Z0000 Encounter for general adult medical examination without abnormal findings: Secondary | ICD-10-CM

## 2022-05-16 ENCOUNTER — Other Ambulatory Visit: Payer: Self-pay | Admitting: Internal Medicine

## 2022-05-16 DIAGNOSIS — Z8719 Personal history of other diseases of the digestive system: Secondary | ICD-10-CM | POA: Diagnosis not present

## 2022-06-29 ENCOUNTER — Ambulatory Visit
Admission: RE | Admit: 2022-06-29 | Discharge: 2022-06-29 | Disposition: A | Discharge status: Home / Self Care | Payer: Medicare Other | Source: Ambulatory Visit | Attending: Internal Medicine | Admitting: Internal Medicine

## 2022-06-29 DIAGNOSIS — N281 Cyst of kidney, acquired: Secondary | ICD-10-CM | POA: Diagnosis not present

## 2022-06-29 DIAGNOSIS — K5732 Diverticulitis of large intestine without perforation or abscess without bleeding: Secondary | ICD-10-CM | POA: Diagnosis not present

## 2022-06-29 DIAGNOSIS — K5733 Diverticulitis of large intestine without perforation or abscess with bleeding: Secondary | ICD-10-CM | POA: Diagnosis not present

## 2022-06-29 DIAGNOSIS — I700 Atherosclerosis of aorta: Secondary | ICD-10-CM | POA: Diagnosis not present

## 2022-06-29 DIAGNOSIS — Z8719 Personal history of other diseases of the digestive system: Secondary | ICD-10-CM | POA: Diagnosis not present

## 2022-07-10 ENCOUNTER — Ambulatory Visit
Admission: RE | Admit: 2022-07-10 | Discharge: 2022-07-10 | Disposition: A | Discharge status: Home / Self Care | Payer: Medicare Other | Source: Ambulatory Visit | Attending: Internal Medicine | Admitting: Internal Medicine

## 2022-07-10 DIAGNOSIS — Z0000 Encounter for general adult medical examination without abnormal findings: Secondary | ICD-10-CM

## 2022-07-10 DIAGNOSIS — Z1231 Encounter for screening mammogram for malignant neoplasm of breast: Secondary | ICD-10-CM | POA: Diagnosis not present

## 2022-07-11 ENCOUNTER — Other Ambulatory Visit: Payer: Self-pay | Admitting: Internal Medicine

## 2022-07-11 DIAGNOSIS — Z1331 Encounter for screening for depression: Secondary | ICD-10-CM | POA: Diagnosis not present

## 2022-07-11 DIAGNOSIS — M810 Age-related osteoporosis without current pathological fracture: Secondary | ICD-10-CM | POA: Diagnosis not present

## 2022-07-11 DIAGNOSIS — I129 Hypertensive chronic kidney disease with stage 1 through stage 4 chronic kidney disease, or unspecified chronic kidney disease: Secondary | ICD-10-CM | POA: Diagnosis not present

## 2022-07-11 DIAGNOSIS — Z0000 Encounter for general adult medical examination without abnormal findings: Secondary | ICD-10-CM | POA: Diagnosis not present

## 2022-07-11 DIAGNOSIS — R928 Other abnormal and inconclusive findings on diagnostic imaging of breast: Secondary | ICD-10-CM

## 2022-07-11 DIAGNOSIS — Z8719 Personal history of other diseases of the digestive system: Secondary | ICD-10-CM | POA: Diagnosis not present

## 2022-07-11 DIAGNOSIS — E039 Hypothyroidism, unspecified: Secondary | ICD-10-CM | POA: Diagnosis not present

## 2022-07-11 DIAGNOSIS — E7800 Pure hypercholesterolemia, unspecified: Secondary | ICD-10-CM | POA: Diagnosis not present

## 2022-07-11 DIAGNOSIS — Z79899 Other long term (current) drug therapy: Secondary | ICD-10-CM | POA: Diagnosis not present

## 2022-07-11 DIAGNOSIS — Z853 Personal history of malignant neoplasm of breast: Secondary | ICD-10-CM | POA: Diagnosis not present

## 2022-07-11 DIAGNOSIS — N1831 Chronic kidney disease, stage 3a: Secondary | ICD-10-CM | POA: Diagnosis not present

## 2022-07-17 ENCOUNTER — Ambulatory Visit: Payer: Medicare Other

## 2022-07-17 ENCOUNTER — Ambulatory Visit
Admission: RE | Admit: 2022-07-17 | Discharge: 2022-07-17 | Disposition: A | Discharge status: Home / Self Care | Payer: Medicare Other | Source: Ambulatory Visit | Attending: Internal Medicine | Admitting: Internal Medicine

## 2022-07-17 DIAGNOSIS — R928 Other abnormal and inconclusive findings on diagnostic imaging of breast: Secondary | ICD-10-CM

## 2022-07-24 DIAGNOSIS — L821 Other seborrheic keratosis: Secondary | ICD-10-CM | POA: Diagnosis not present

## 2022-07-24 DIAGNOSIS — L814 Other melanin hyperpigmentation: Secondary | ICD-10-CM | POA: Diagnosis not present

## 2022-07-24 DIAGNOSIS — L820 Inflamed seborrheic keratosis: Secondary | ICD-10-CM | POA: Diagnosis not present

## 2022-07-24 DIAGNOSIS — L578 Other skin changes due to chronic exposure to nonionizing radiation: Secondary | ICD-10-CM | POA: Diagnosis not present

## 2022-07-24 DIAGNOSIS — D1801 Hemangioma of skin and subcutaneous tissue: Secondary | ICD-10-CM | POA: Diagnosis not present

## 2022-08-14 DIAGNOSIS — K5792 Diverticulitis of intestine, part unspecified, without perforation or abscess without bleeding: Secondary | ICD-10-CM | POA: Diagnosis not present

## 2022-08-29 DIAGNOSIS — Z01411 Encounter for gynecological examination (general) (routine) with abnormal findings: Secondary | ICD-10-CM | POA: Diagnosis not present

## 2022-08-29 DIAGNOSIS — Z6829 Body mass index (BMI) 29.0-29.9, adult: Secondary | ICD-10-CM | POA: Diagnosis not present

## 2022-08-29 DIAGNOSIS — Z01419 Encounter for gynecological examination (general) (routine) without abnormal findings: Secondary | ICD-10-CM | POA: Diagnosis not present

## 2022-08-29 DIAGNOSIS — Z124 Encounter for screening for malignant neoplasm of cervix: Secondary | ICD-10-CM | POA: Diagnosis not present

## 2023-01-12 DIAGNOSIS — Z23 Encounter for immunization: Secondary | ICD-10-CM | POA: Diagnosis not present

## 2023-01-12 DIAGNOSIS — I129 Hypertensive chronic kidney disease with stage 1 through stage 4 chronic kidney disease, or unspecified chronic kidney disease: Secondary | ICD-10-CM | POA: Diagnosis not present

## 2023-01-12 DIAGNOSIS — E039 Hypothyroidism, unspecified: Secondary | ICD-10-CM | POA: Diagnosis not present

## 2023-01-12 DIAGNOSIS — Z8719 Personal history of other diseases of the digestive system: Secondary | ICD-10-CM | POA: Diagnosis not present

## 2023-01-12 DIAGNOSIS — N1831 Chronic kidney disease, stage 3a: Secondary | ICD-10-CM | POA: Diagnosis not present

## 2023-01-12 DIAGNOSIS — E7800 Pure hypercholesterolemia, unspecified: Secondary | ICD-10-CM | POA: Diagnosis not present

## 2023-01-18 DIAGNOSIS — M810 Age-related osteoporosis without current pathological fracture: Secondary | ICD-10-CM | POA: Diagnosis not present

## 2023-03-23 DIAGNOSIS — I129 Hypertensive chronic kidney disease with stage 1 through stage 4 chronic kidney disease, or unspecified chronic kidney disease: Secondary | ICD-10-CM | POA: Diagnosis not present

## 2023-03-23 DIAGNOSIS — R1030 Lower abdominal pain, unspecified: Secondary | ICD-10-CM | POA: Diagnosis not present

## 2023-04-17 DIAGNOSIS — I129 Hypertensive chronic kidney disease with stage 1 through stage 4 chronic kidney disease, or unspecified chronic kidney disease: Secondary | ICD-10-CM | POA: Diagnosis not present

## 2023-04-17 DIAGNOSIS — N1831 Chronic kidney disease, stage 3a: Secondary | ICD-10-CM | POA: Diagnosis not present

## 2023-05-02 DIAGNOSIS — I129 Hypertensive chronic kidney disease with stage 1 through stage 4 chronic kidney disease, or unspecified chronic kidney disease: Secondary | ICD-10-CM | POA: Diagnosis not present

## 2023-05-02 DIAGNOSIS — Z853 Personal history of malignant neoplasm of breast: Secondary | ICD-10-CM | POA: Diagnosis not present

## 2023-05-02 DIAGNOSIS — E039 Hypothyroidism, unspecified: Secondary | ICD-10-CM | POA: Diagnosis not present

## 2023-05-02 DIAGNOSIS — N1831 Chronic kidney disease, stage 3a: Secondary | ICD-10-CM | POA: Diagnosis not present

## 2023-05-02 DIAGNOSIS — E7800 Pure hypercholesterolemia, unspecified: Secondary | ICD-10-CM | POA: Diagnosis not present

## 2023-05-16 DIAGNOSIS — N1831 Chronic kidney disease, stage 3a: Secondary | ICD-10-CM | POA: Diagnosis not present

## 2023-05-16 DIAGNOSIS — I129 Hypertensive chronic kidney disease with stage 1 through stage 4 chronic kidney disease, or unspecified chronic kidney disease: Secondary | ICD-10-CM | POA: Diagnosis not present

## 2023-06-02 DIAGNOSIS — N1831 Chronic kidney disease, stage 3a: Secondary | ICD-10-CM | POA: Diagnosis not present

## 2023-06-02 DIAGNOSIS — E039 Hypothyroidism, unspecified: Secondary | ICD-10-CM | POA: Diagnosis not present

## 2023-06-02 DIAGNOSIS — E7800 Pure hypercholesterolemia, unspecified: Secondary | ICD-10-CM | POA: Diagnosis not present

## 2023-06-02 DIAGNOSIS — I129 Hypertensive chronic kidney disease with stage 1 through stage 4 chronic kidney disease, or unspecified chronic kidney disease: Secondary | ICD-10-CM | POA: Diagnosis not present

## 2023-06-02 DIAGNOSIS — Z853 Personal history of malignant neoplasm of breast: Secondary | ICD-10-CM | POA: Diagnosis not present

## 2023-06-15 DIAGNOSIS — I129 Hypertensive chronic kidney disease with stage 1 through stage 4 chronic kidney disease, or unspecified chronic kidney disease: Secondary | ICD-10-CM | POA: Diagnosis not present

## 2023-06-15 DIAGNOSIS — N1831 Chronic kidney disease, stage 3a: Secondary | ICD-10-CM | POA: Diagnosis not present

## 2023-06-27 ENCOUNTER — Other Ambulatory Visit: Payer: Self-pay | Admitting: Internal Medicine

## 2023-06-27 DIAGNOSIS — Z1231 Encounter for screening mammogram for malignant neoplasm of breast: Secondary | ICD-10-CM

## 2023-07-15 DIAGNOSIS — I129 Hypertensive chronic kidney disease with stage 1 through stage 4 chronic kidney disease, or unspecified chronic kidney disease: Secondary | ICD-10-CM | POA: Diagnosis not present

## 2023-07-15 DIAGNOSIS — N1831 Chronic kidney disease, stage 3a: Secondary | ICD-10-CM | POA: Diagnosis not present

## 2023-07-19 ENCOUNTER — Ambulatory Visit
Admission: RE | Admit: 2023-07-19 | Discharge: 2023-07-19 | Disposition: A | Discharge status: Home / Self Care | Source: Ambulatory Visit | Attending: Internal Medicine | Admitting: Internal Medicine

## 2023-07-19 DIAGNOSIS — Z1231 Encounter for screening mammogram for malignant neoplasm of breast: Secondary | ICD-10-CM | POA: Diagnosis not present

## 2023-07-19 DIAGNOSIS — M810 Age-related osteoporosis without current pathological fracture: Secondary | ICD-10-CM | POA: Diagnosis not present

## 2023-07-30 DIAGNOSIS — I129 Hypertensive chronic kidney disease with stage 1 through stage 4 chronic kidney disease, or unspecified chronic kidney disease: Secondary | ICD-10-CM | POA: Diagnosis not present

## 2023-07-30 DIAGNOSIS — Z0000 Encounter for general adult medical examination without abnormal findings: Secondary | ICD-10-CM | POA: Diagnosis not present

## 2023-07-30 DIAGNOSIS — N1831 Chronic kidney disease, stage 3a: Secondary | ICD-10-CM | POA: Diagnosis not present

## 2023-07-30 DIAGNOSIS — M810 Age-related osteoporosis without current pathological fracture: Secondary | ICD-10-CM | POA: Diagnosis not present

## 2023-07-30 DIAGNOSIS — Z1331 Encounter for screening for depression: Secondary | ICD-10-CM | POA: Diagnosis not present

## 2023-07-30 DIAGNOSIS — Z79899 Other long term (current) drug therapy: Secondary | ICD-10-CM | POA: Diagnosis not present

## 2023-07-30 DIAGNOSIS — E039 Hypothyroidism, unspecified: Secondary | ICD-10-CM | POA: Diagnosis not present

## 2023-07-30 DIAGNOSIS — Z853 Personal history of malignant neoplasm of breast: Secondary | ICD-10-CM | POA: Diagnosis not present

## 2023-07-30 DIAGNOSIS — E7800 Pure hypercholesterolemia, unspecified: Secondary | ICD-10-CM | POA: Diagnosis not present

## 2023-07-30 DIAGNOSIS — J069 Acute upper respiratory infection, unspecified: Secondary | ICD-10-CM | POA: Diagnosis not present

## 2023-08-01 ENCOUNTER — Other Ambulatory Visit (HOSPITAL_BASED_OUTPATIENT_CLINIC_OR_DEPARTMENT_OTHER): Payer: Self-pay | Admitting: Internal Medicine

## 2023-08-01 DIAGNOSIS — M810 Age-related osteoporosis without current pathological fracture: Secondary | ICD-10-CM

## 2023-08-02 DIAGNOSIS — Z853 Personal history of malignant neoplasm of breast: Secondary | ICD-10-CM | POA: Diagnosis not present

## 2023-08-02 DIAGNOSIS — I129 Hypertensive chronic kidney disease with stage 1 through stage 4 chronic kidney disease, or unspecified chronic kidney disease: Secondary | ICD-10-CM | POA: Diagnosis not present

## 2023-08-02 DIAGNOSIS — N1831 Chronic kidney disease, stage 3a: Secondary | ICD-10-CM | POA: Diagnosis not present

## 2023-08-02 DIAGNOSIS — E039 Hypothyroidism, unspecified: Secondary | ICD-10-CM | POA: Diagnosis not present

## 2023-08-02 DIAGNOSIS — E7800 Pure hypercholesterolemia, unspecified: Secondary | ICD-10-CM | POA: Diagnosis not present

## 2023-08-14 DIAGNOSIS — I129 Hypertensive chronic kidney disease with stage 1 through stage 4 chronic kidney disease, or unspecified chronic kidney disease: Secondary | ICD-10-CM | POA: Diagnosis not present

## 2023-08-14 DIAGNOSIS — N1831 Chronic kidney disease, stage 3a: Secondary | ICD-10-CM | POA: Diagnosis not present

## 2023-09-02 DIAGNOSIS — Z853 Personal history of malignant neoplasm of breast: Secondary | ICD-10-CM | POA: Diagnosis not present

## 2023-09-02 DIAGNOSIS — E039 Hypothyroidism, unspecified: Secondary | ICD-10-CM | POA: Diagnosis not present

## 2023-09-02 DIAGNOSIS — E7800 Pure hypercholesterolemia, unspecified: Secondary | ICD-10-CM | POA: Diagnosis not present

## 2023-09-02 DIAGNOSIS — I129 Hypertensive chronic kidney disease with stage 1 through stage 4 chronic kidney disease, or unspecified chronic kidney disease: Secondary | ICD-10-CM | POA: Diagnosis not present

## 2023-09-02 DIAGNOSIS — N1831 Chronic kidney disease, stage 3a: Secondary | ICD-10-CM | POA: Diagnosis not present

## 2023-09-22 DIAGNOSIS — I129 Hypertensive chronic kidney disease with stage 1 through stage 4 chronic kidney disease, or unspecified chronic kidney disease: Secondary | ICD-10-CM | POA: Diagnosis not present

## 2023-09-22 DIAGNOSIS — N1831 Chronic kidney disease, stage 3a: Secondary | ICD-10-CM | POA: Diagnosis not present

## 2023-10-02 DIAGNOSIS — E039 Hypothyroidism, unspecified: Secondary | ICD-10-CM | POA: Diagnosis not present

## 2023-10-02 DIAGNOSIS — Z853 Personal history of malignant neoplasm of breast: Secondary | ICD-10-CM | POA: Diagnosis not present

## 2023-10-02 DIAGNOSIS — N1831 Chronic kidney disease, stage 3a: Secondary | ICD-10-CM | POA: Diagnosis not present

## 2023-10-02 DIAGNOSIS — I129 Hypertensive chronic kidney disease with stage 1 through stage 4 chronic kidney disease, or unspecified chronic kidney disease: Secondary | ICD-10-CM | POA: Diagnosis not present

## 2023-10-02 DIAGNOSIS — E7800 Pure hypercholesterolemia, unspecified: Secondary | ICD-10-CM | POA: Diagnosis not present

## 2023-10-07 DIAGNOSIS — I872 Venous insufficiency (chronic) (peripheral): Secondary | ICD-10-CM | POA: Diagnosis not present

## 2023-10-07 DIAGNOSIS — K5732 Diverticulitis of large intestine without perforation or abscess without bleeding: Secondary | ICD-10-CM | POA: Diagnosis not present

## 2023-10-22 DIAGNOSIS — I129 Hypertensive chronic kidney disease with stage 1 through stage 4 chronic kidney disease, or unspecified chronic kidney disease: Secondary | ICD-10-CM | POA: Diagnosis not present

## 2023-10-22 DIAGNOSIS — N1831 Chronic kidney disease, stage 3a: Secondary | ICD-10-CM | POA: Diagnosis not present

## 2023-11-02 DIAGNOSIS — Z853 Personal history of malignant neoplasm of breast: Secondary | ICD-10-CM | POA: Diagnosis not present

## 2023-11-02 DIAGNOSIS — N1831 Chronic kidney disease, stage 3a: Secondary | ICD-10-CM | POA: Diagnosis not present

## 2023-11-02 DIAGNOSIS — I129 Hypertensive chronic kidney disease with stage 1 through stage 4 chronic kidney disease, or unspecified chronic kidney disease: Secondary | ICD-10-CM | POA: Diagnosis not present

## 2023-11-02 DIAGNOSIS — E039 Hypothyroidism, unspecified: Secondary | ICD-10-CM | POA: Diagnosis not present

## 2023-11-02 DIAGNOSIS — E7800 Pure hypercholesterolemia, unspecified: Secondary | ICD-10-CM | POA: Diagnosis not present
